# Patient Record
Sex: Female | Born: 1950
Health system: Southern US, Community
[De-identification: ages and names within clinical notes are randomized; demographics above are authoritative.]

## PROBLEM LIST (undated history)

## (undated) DIAGNOSIS — E785 Hyperlipidemia, unspecified: Secondary | ICD-10-CM

## (undated) DIAGNOSIS — F32A Depression, unspecified: Secondary | ICD-10-CM

## (undated) DIAGNOSIS — F419 Anxiety disorder, unspecified: Secondary | ICD-10-CM

## (undated) DIAGNOSIS — H9313 Tinnitus, bilateral: Secondary | ICD-10-CM

## (undated) HISTORY — DX: Depression, unspecified: F32.A

## (undated) HISTORY — DX: Tinnitus, bilateral: H93.13

## (undated) HISTORY — DX: Anxiety disorder, unspecified: F41.9

## (undated) HISTORY — DX: Hyperlipidemia, unspecified: E78.5

---

## 2009-12-01 ENCOUNTER — Other Ambulatory Visit: Admission: RE | Admit: 2009-12-01 | Discharge: 2009-12-01 | Payer: Self-pay | Admitting: *Deleted

## 2009-12-05 ENCOUNTER — Encounter: Admission: RE | Admit: 2009-12-05 | Discharge: 2009-12-05 | Payer: Self-pay | Admitting: *Deleted

## 2010-09-01 ENCOUNTER — Other Ambulatory Visit: Payer: Self-pay | Admitting: Internal Medicine

## 2010-09-01 DIAGNOSIS — R102 Pelvic and perineal pain: Secondary | ICD-10-CM

## 2010-09-03 ENCOUNTER — Other Ambulatory Visit: Payer: Self-pay

## 2010-09-03 ENCOUNTER — Ambulatory Visit
Admission: RE | Admit: 2010-09-03 | Discharge: 2010-09-03 | Disposition: A | Discharge status: Home / Self Care | Payer: Managed Care, Other (non HMO) | Source: Ambulatory Visit | Attending: Internal Medicine | Admitting: Internal Medicine

## 2010-09-03 DIAGNOSIS — R102 Pelvic and perineal pain: Secondary | ICD-10-CM

## 2011-11-22 ENCOUNTER — Other Ambulatory Visit (HOSPITAL_COMMUNITY)
Admission: RE | Admit: 2011-11-22 | Discharge: 2011-11-22 | Disposition: A | Discharge status: Home / Self Care | Payer: 59 | Source: Ambulatory Visit | Attending: Family Medicine | Admitting: Family Medicine

## 2011-11-22 ENCOUNTER — Other Ambulatory Visit: Payer: Self-pay | Admitting: Family Medicine

## 2011-11-22 DIAGNOSIS — Z780 Asymptomatic menopausal state: Secondary | ICD-10-CM

## 2011-11-22 DIAGNOSIS — Z1231 Encounter for screening mammogram for malignant neoplasm of breast: Secondary | ICD-10-CM

## 2011-11-22 DIAGNOSIS — Z01419 Encounter for gynecological examination (general) (routine) without abnormal findings: Secondary | ICD-10-CM | POA: Insufficient documentation

## 2011-12-15 ENCOUNTER — Ambulatory Visit
Admission: RE | Admit: 2011-12-15 | Discharge: 2011-12-15 | Disposition: A | Discharge status: Home / Self Care | Payer: Managed Care, Other (non HMO) | Source: Ambulatory Visit | Attending: Family Medicine | Admitting: Family Medicine

## 2011-12-15 ENCOUNTER — Other Ambulatory Visit: Payer: Managed Care, Other (non HMO)

## 2011-12-15 DIAGNOSIS — Z1231 Encounter for screening mammogram for malignant neoplasm of breast: Secondary | ICD-10-CM

## 2011-12-27 ENCOUNTER — Ambulatory Visit
Admission: RE | Admit: 2011-12-27 | Discharge: 2011-12-27 | Disposition: A | Discharge status: Home / Self Care | Payer: 59 | Source: Ambulatory Visit | Attending: Family Medicine | Admitting: Family Medicine

## 2011-12-27 DIAGNOSIS — Z780 Asymptomatic menopausal state: Secondary | ICD-10-CM

## 2013-03-07 LAB — HM COLONOSCOPY

## 2013-11-13 ENCOUNTER — Other Ambulatory Visit: Payer: Self-pay | Admitting: Family Medicine

## 2013-11-13 DIAGNOSIS — M8580 Other specified disorders of bone density and structure, unspecified site: Secondary | ICD-10-CM

## 2013-11-13 DIAGNOSIS — Z1231 Encounter for screening mammogram for malignant neoplasm of breast: Secondary | ICD-10-CM

## 2014-01-01 ENCOUNTER — Ambulatory Visit
Admission: RE | Admit: 2014-01-01 | Discharge: 2014-01-01 | Disposition: A | Discharge status: Home / Self Care | Payer: BC Managed Care – PPO | Source: Ambulatory Visit | Attending: Family Medicine | Admitting: Family Medicine

## 2014-01-01 ENCOUNTER — Encounter (INDEPENDENT_AMBULATORY_CARE_PROVIDER_SITE_OTHER): Payer: Self-pay

## 2014-01-01 DIAGNOSIS — Z1231 Encounter for screening mammogram for malignant neoplasm of breast: Secondary | ICD-10-CM

## 2014-01-01 DIAGNOSIS — M8580 Other specified disorders of bone density and structure, unspecified site: Secondary | ICD-10-CM

## 2014-08-15 ENCOUNTER — Other Ambulatory Visit: Payer: Self-pay | Admitting: Sports Medicine

## 2014-08-15 DIAGNOSIS — M79605 Pain in left leg: Secondary | ICD-10-CM

## 2014-08-15 DIAGNOSIS — M545 Low back pain: Secondary | ICD-10-CM

## 2014-08-25 ENCOUNTER — Ambulatory Visit
Admission: RE | Admit: 2014-08-25 | Discharge: 2014-08-25 | Disposition: A | Discharge status: Home / Self Care | Payer: Self-pay | Source: Ambulatory Visit | Attending: Sports Medicine | Admitting: Sports Medicine

## 2014-08-25 DIAGNOSIS — M79605 Pain in left leg: Secondary | ICD-10-CM

## 2014-08-25 DIAGNOSIS — M545 Low back pain: Secondary | ICD-10-CM

## 2015-11-18 ENCOUNTER — Other Ambulatory Visit: Payer: Self-pay | Admitting: Family Medicine

## 2015-11-18 ENCOUNTER — Other Ambulatory Visit (HOSPITAL_COMMUNITY)
Admission: RE | Admit: 2015-11-18 | Discharge: 2015-11-18 | Disposition: A | Discharge status: Home / Self Care | Payer: BLUE CROSS/BLUE SHIELD | Source: Ambulatory Visit | Attending: Family Medicine | Admitting: Family Medicine

## 2015-11-18 DIAGNOSIS — Z01419 Encounter for gynecological examination (general) (routine) without abnormal findings: Secondary | ICD-10-CM | POA: Insufficient documentation

## 2015-11-20 LAB — CYTOLOGY - PAP

## 2016-08-12 ENCOUNTER — Other Ambulatory Visit: Payer: Self-pay | Admitting: Family Medicine

## 2016-08-12 ENCOUNTER — Ambulatory Visit
Admission: RE | Admit: 2016-08-12 | Discharge: 2016-08-12 | Disposition: A | Discharge status: Home / Self Care | Payer: BLUE CROSS/BLUE SHIELD | Source: Ambulatory Visit | Attending: Family Medicine | Admitting: Family Medicine

## 2016-08-12 DIAGNOSIS — R042 Hemoptysis: Secondary | ICD-10-CM

## 2017-04-21 ENCOUNTER — Other Ambulatory Visit: Payer: Self-pay | Admitting: Family Medicine

## 2017-04-21 DIAGNOSIS — Z1231 Encounter for screening mammogram for malignant neoplasm of breast: Secondary | ICD-10-CM

## 2017-05-04 ENCOUNTER — Ambulatory Visit
Admission: RE | Admit: 2017-05-04 | Discharge: 2017-05-04 | Disposition: A | Discharge status: Home / Self Care | Payer: BLUE CROSS/BLUE SHIELD | Source: Ambulatory Visit | Attending: Family Medicine | Admitting: Family Medicine

## 2017-05-04 DIAGNOSIS — Z1231 Encounter for screening mammogram for malignant neoplasm of breast: Secondary | ICD-10-CM

## 2018-03-07 DIAGNOSIS — L570 Actinic keratosis: Secondary | ICD-10-CM | POA: Diagnosis not present

## 2018-03-07 DIAGNOSIS — L821 Other seborrheic keratosis: Secondary | ICD-10-CM | POA: Diagnosis not present

## 2018-03-07 DIAGNOSIS — L820 Inflamed seborrheic keratosis: Secondary | ICD-10-CM | POA: Diagnosis not present

## 2018-03-07 DIAGNOSIS — D1801 Hemangioma of skin and subcutaneous tissue: Secondary | ICD-10-CM | POA: Diagnosis not present

## 2018-03-07 DIAGNOSIS — L812 Freckles: Secondary | ICD-10-CM | POA: Diagnosis not present

## 2018-03-07 DIAGNOSIS — Z85828 Personal history of other malignant neoplasm of skin: Secondary | ICD-10-CM | POA: Diagnosis not present

## 2018-04-24 DIAGNOSIS — E782 Mixed hyperlipidemia: Secondary | ICD-10-CM | POA: Diagnosis not present

## 2018-04-24 DIAGNOSIS — Z23 Encounter for immunization: Secondary | ICD-10-CM | POA: Diagnosis not present

## 2018-04-24 DIAGNOSIS — Z5181 Encounter for therapeutic drug level monitoring: Secondary | ICD-10-CM | POA: Diagnosis not present

## 2018-04-24 DIAGNOSIS — Z0000 Encounter for general adult medical examination without abnormal findings: Secondary | ICD-10-CM | POA: Diagnosis not present

## 2018-05-05 ENCOUNTER — Ambulatory Visit
Admission: RE | Admit: 2018-05-05 | Discharge: 2018-05-05 | Disposition: A | Discharge status: Home / Self Care | Payer: PPO | Source: Ambulatory Visit

## 2018-05-05 DIAGNOSIS — Z1231 Encounter for screening mammogram for malignant neoplasm of breast: Secondary | ICD-10-CM

## 2018-05-16 DIAGNOSIS — K1121 Acute sialoadenitis: Secondary | ICD-10-CM | POA: Diagnosis not present

## 2018-05-16 DIAGNOSIS — H6123 Impacted cerumen, bilateral: Secondary | ICD-10-CM | POA: Diagnosis not present

## 2018-07-10 DIAGNOSIS — J3501 Chronic tonsillitis: Secondary | ICD-10-CM | POA: Diagnosis not present

## 2018-07-10 DIAGNOSIS — K1120 Sialoadenitis, unspecified: Secondary | ICD-10-CM | POA: Diagnosis not present

## 2018-07-10 DIAGNOSIS — J370 Chronic laryngitis: Secondary | ICD-10-CM | POA: Diagnosis not present

## 2018-07-17 DIAGNOSIS — K1120 Sialoadenitis, unspecified: Secondary | ICD-10-CM | POA: Diagnosis not present

## 2018-09-18 DIAGNOSIS — L812 Freckles: Secondary | ICD-10-CM | POA: Diagnosis not present

## 2018-09-18 DIAGNOSIS — L821 Other seborrheic keratosis: Secondary | ICD-10-CM | POA: Diagnosis not present

## 2018-09-18 DIAGNOSIS — L570 Actinic keratosis: Secondary | ICD-10-CM | POA: Diagnosis not present

## 2018-09-18 DIAGNOSIS — L820 Inflamed seborrheic keratosis: Secondary | ICD-10-CM | POA: Diagnosis not present

## 2018-09-18 DIAGNOSIS — Z85828 Personal history of other malignant neoplasm of skin: Secondary | ICD-10-CM | POA: Diagnosis not present

## 2018-09-18 DIAGNOSIS — D1801 Hemangioma of skin and subcutaneous tissue: Secondary | ICD-10-CM | POA: Diagnosis not present

## 2018-11-01 DIAGNOSIS — R05 Cough: Secondary | ICD-10-CM | POA: Diagnosis not present

## 2018-12-07 DIAGNOSIS — Z03818 Encounter for observation for suspected exposure to other biological agents ruled out: Secondary | ICD-10-CM | POA: Diagnosis not present

## 2019-01-02 ENCOUNTER — Other Ambulatory Visit: Payer: Self-pay | Admitting: Family Medicine

## 2019-01-02 ENCOUNTER — Other Ambulatory Visit: Payer: Self-pay

## 2019-01-02 ENCOUNTER — Ambulatory Visit
Admission: RE | Admit: 2019-01-02 | Discharge: 2019-01-02 | Disposition: A | Discharge status: Home / Self Care | Payer: PPO | Source: Ambulatory Visit | Attending: Family Medicine | Admitting: Family Medicine

## 2019-01-02 DIAGNOSIS — R059 Cough, unspecified: Secondary | ICD-10-CM

## 2019-01-02 DIAGNOSIS — R05 Cough: Secondary | ICD-10-CM | POA: Diagnosis not present

## 2019-03-08 DIAGNOSIS — R52 Pain, unspecified: Secondary | ICD-10-CM | POA: Diagnosis not present

## 2019-03-08 DIAGNOSIS — R0789 Other chest pain: Secondary | ICD-10-CM | POA: Diagnosis not present

## 2019-04-25 DIAGNOSIS — L570 Actinic keratosis: Secondary | ICD-10-CM | POA: Diagnosis not present

## 2019-04-25 DIAGNOSIS — Z85828 Personal history of other malignant neoplasm of skin: Secondary | ICD-10-CM | POA: Diagnosis not present

## 2019-04-25 DIAGNOSIS — L820 Inflamed seborrheic keratosis: Secondary | ICD-10-CM | POA: Diagnosis not present

## 2019-04-25 DIAGNOSIS — D485 Neoplasm of uncertain behavior of skin: Secondary | ICD-10-CM | POA: Diagnosis not present

## 2019-05-10 ENCOUNTER — Other Ambulatory Visit: Payer: Self-pay | Admitting: Family Medicine

## 2019-05-10 DIAGNOSIS — Z1231 Encounter for screening mammogram for malignant neoplasm of breast: Secondary | ICD-10-CM

## 2019-05-14 DIAGNOSIS — E782 Mixed hyperlipidemia: Secondary | ICD-10-CM | POA: Diagnosis not present

## 2019-05-14 DIAGNOSIS — Z5181 Encounter for therapeutic drug level monitoring: Secondary | ICD-10-CM | POA: Diagnosis not present

## 2019-05-15 ENCOUNTER — Ambulatory Visit
Admission: RE | Admit: 2019-05-15 | Discharge: 2019-05-15 | Disposition: A | Discharge status: Home / Self Care | Payer: PPO | Source: Ambulatory Visit | Attending: Family Medicine | Admitting: Family Medicine

## 2019-05-15 ENCOUNTER — Other Ambulatory Visit: Payer: Self-pay

## 2019-05-15 DIAGNOSIS — Z1231 Encounter for screening mammogram for malignant neoplasm of breast: Secondary | ICD-10-CM

## 2019-05-16 DIAGNOSIS — Z0000 Encounter for general adult medical examination without abnormal findings: Secondary | ICD-10-CM | POA: Diagnosis not present

## 2019-05-16 DIAGNOSIS — F330 Major depressive disorder, recurrent, mild: Secondary | ICD-10-CM | POA: Diagnosis not present

## 2019-05-16 DIAGNOSIS — Z1211 Encounter for screening for malignant neoplasm of colon: Secondary | ICD-10-CM | POA: Diagnosis not present

## 2019-05-17 ENCOUNTER — Other Ambulatory Visit: Payer: Self-pay | Admitting: Family Medicine

## 2019-05-17 DIAGNOSIS — R928 Other abnormal and inconclusive findings on diagnostic imaging of breast: Secondary | ICD-10-CM

## 2019-05-17 DIAGNOSIS — N6489 Other specified disorders of breast: Secondary | ICD-10-CM

## 2019-05-18 DIAGNOSIS — Z23 Encounter for immunization: Secondary | ICD-10-CM | POA: Diagnosis not present

## 2019-05-18 DIAGNOSIS — H6123 Impacted cerumen, bilateral: Secondary | ICD-10-CM | POA: Diagnosis not present

## 2019-05-21 ENCOUNTER — Ambulatory Visit
Admission: RE | Admit: 2019-05-21 | Discharge: 2019-05-21 | Disposition: A | Discharge status: Home / Self Care | Payer: PPO | Source: Ambulatory Visit | Attending: Family Medicine | Admitting: Family Medicine

## 2019-05-21 ENCOUNTER — Other Ambulatory Visit: Payer: Self-pay

## 2019-05-21 DIAGNOSIS — N6002 Solitary cyst of left breast: Secondary | ICD-10-CM | POA: Diagnosis not present

## 2019-05-21 DIAGNOSIS — R928 Other abnormal and inconclusive findings on diagnostic imaging of breast: Secondary | ICD-10-CM

## 2019-05-22 DIAGNOSIS — R05 Cough: Secondary | ICD-10-CM | POA: Diagnosis not present

## 2019-05-24 DIAGNOSIS — U071 COVID-19: Secondary | ICD-10-CM | POA: Diagnosis not present

## 2019-05-29 DIAGNOSIS — U071 COVID-19: Secondary | ICD-10-CM | POA: Diagnosis not present

## 2019-06-18 ENCOUNTER — Other Ambulatory Visit: Payer: Self-pay | Admitting: Family Medicine

## 2019-06-21 ENCOUNTER — Other Ambulatory Visit: Payer: Self-pay

## 2019-06-21 DIAGNOSIS — Z20822 Contact with and (suspected) exposure to covid-19: Secondary | ICD-10-CM

## 2019-06-24 LAB — NOVEL CORONAVIRUS, NAA: SARS-CoV-2, NAA: NOT DETECTED

## 2019-07-18 DIAGNOSIS — H2513 Age-related nuclear cataract, bilateral: Secondary | ICD-10-CM | POA: Diagnosis not present

## 2019-07-26 DIAGNOSIS — H00024 Hordeolum internum left upper eyelid: Secondary | ICD-10-CM | POA: Diagnosis not present

## 2019-10-04 DIAGNOSIS — B078 Other viral warts: Secondary | ICD-10-CM | POA: Diagnosis not present

## 2019-10-04 DIAGNOSIS — L820 Inflamed seborrheic keratosis: Secondary | ICD-10-CM | POA: Diagnosis not present

## 2019-10-04 DIAGNOSIS — L812 Freckles: Secondary | ICD-10-CM | POA: Diagnosis not present

## 2019-10-04 DIAGNOSIS — B079 Viral wart, unspecified: Secondary | ICD-10-CM | POA: Diagnosis not present

## 2019-10-04 DIAGNOSIS — Z85828 Personal history of other malignant neoplasm of skin: Secondary | ICD-10-CM | POA: Diagnosis not present

## 2019-10-04 DIAGNOSIS — D485 Neoplasm of uncertain behavior of skin: Secondary | ICD-10-CM | POA: Diagnosis not present

## 2019-10-04 DIAGNOSIS — L821 Other seborrheic keratosis: Secondary | ICD-10-CM | POA: Diagnosis not present

## 2019-10-16 IMAGING — DX CHEST - 2 VIEW
2 series · 2 of 2 positions shown · non-contrast
Comparison: 08/12/2016

CLINICAL DATA: Cough 3 months.

EXAM:
CHEST - 2 VIEW

[dg chest 2 view (1 of 2)]
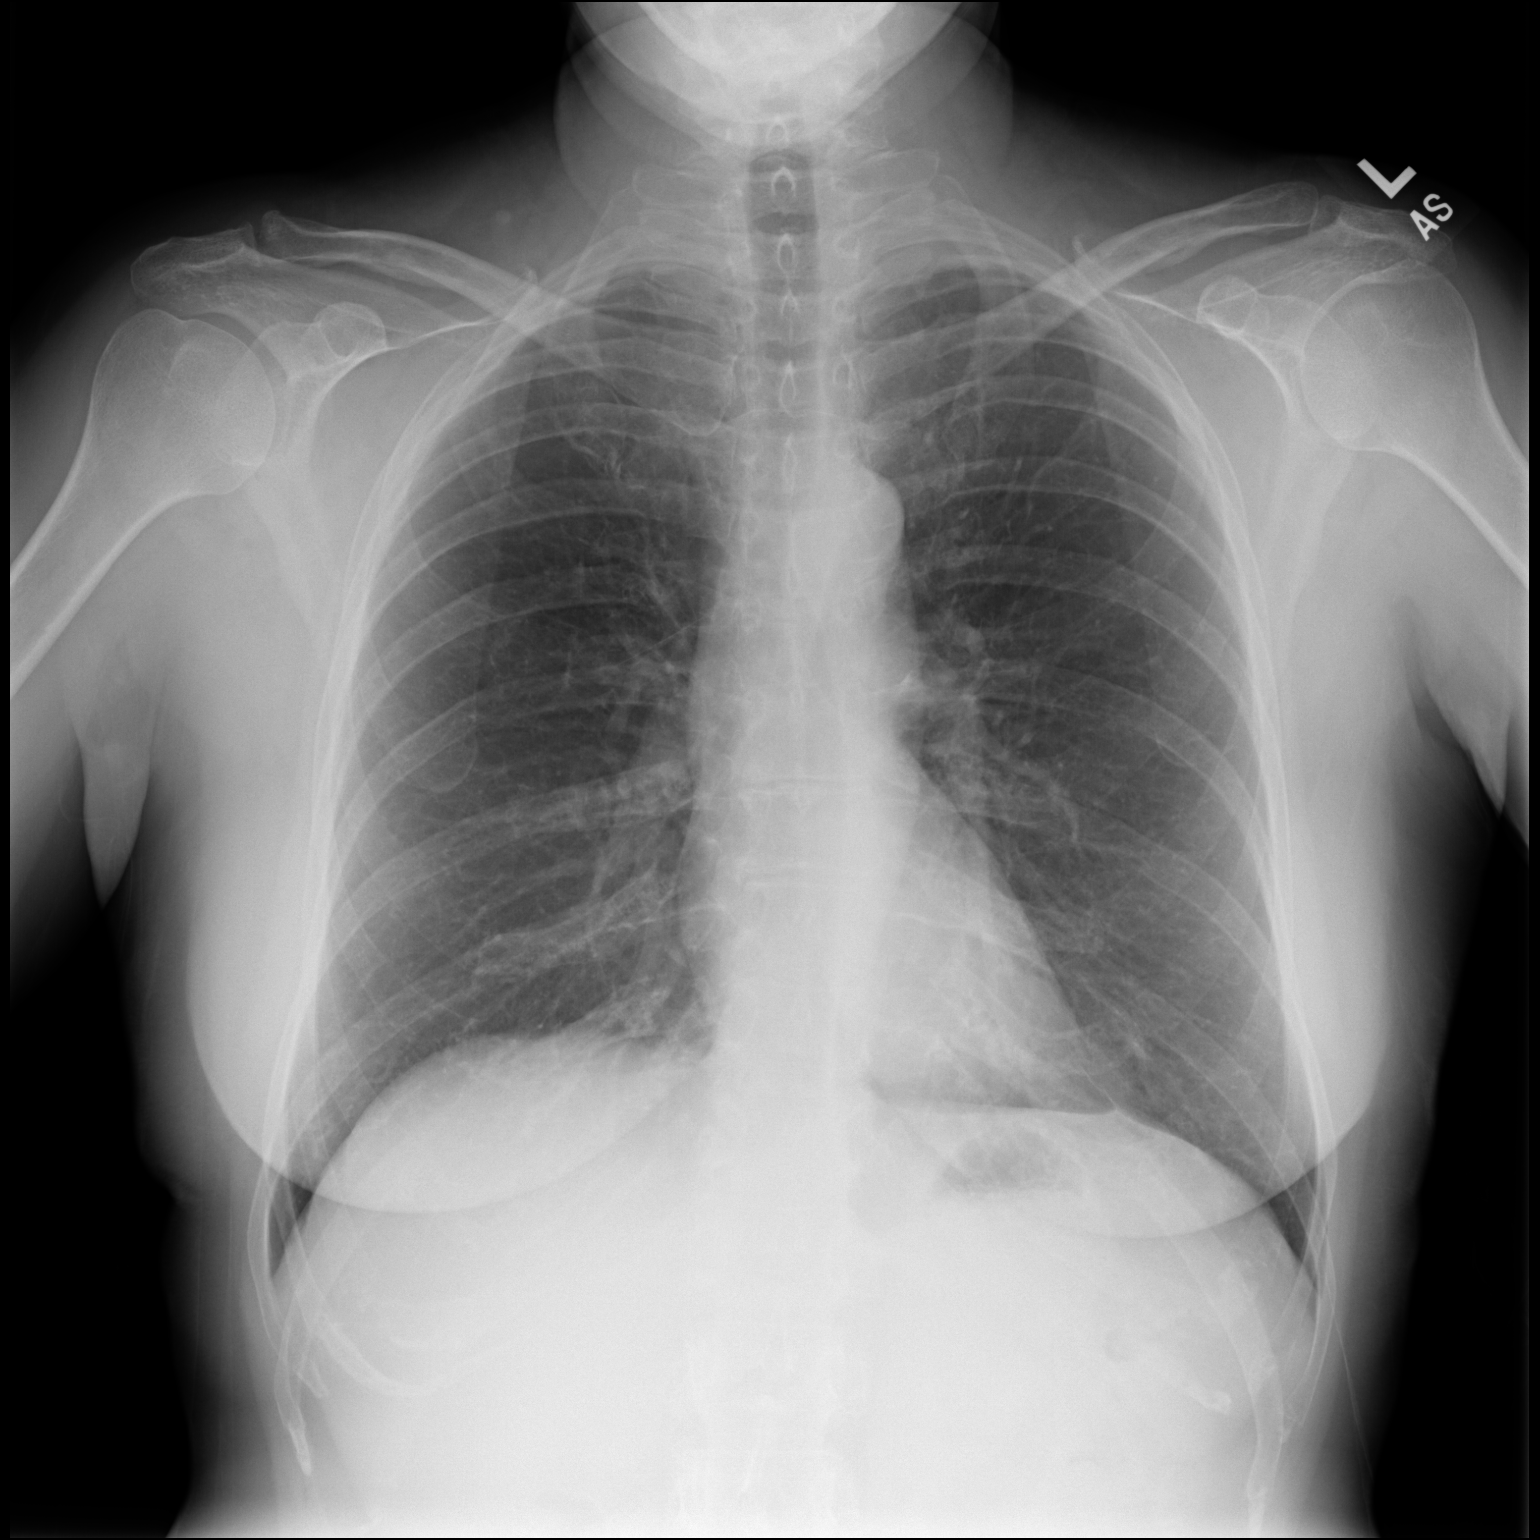

[dg chest 2 view (2 of 2)]
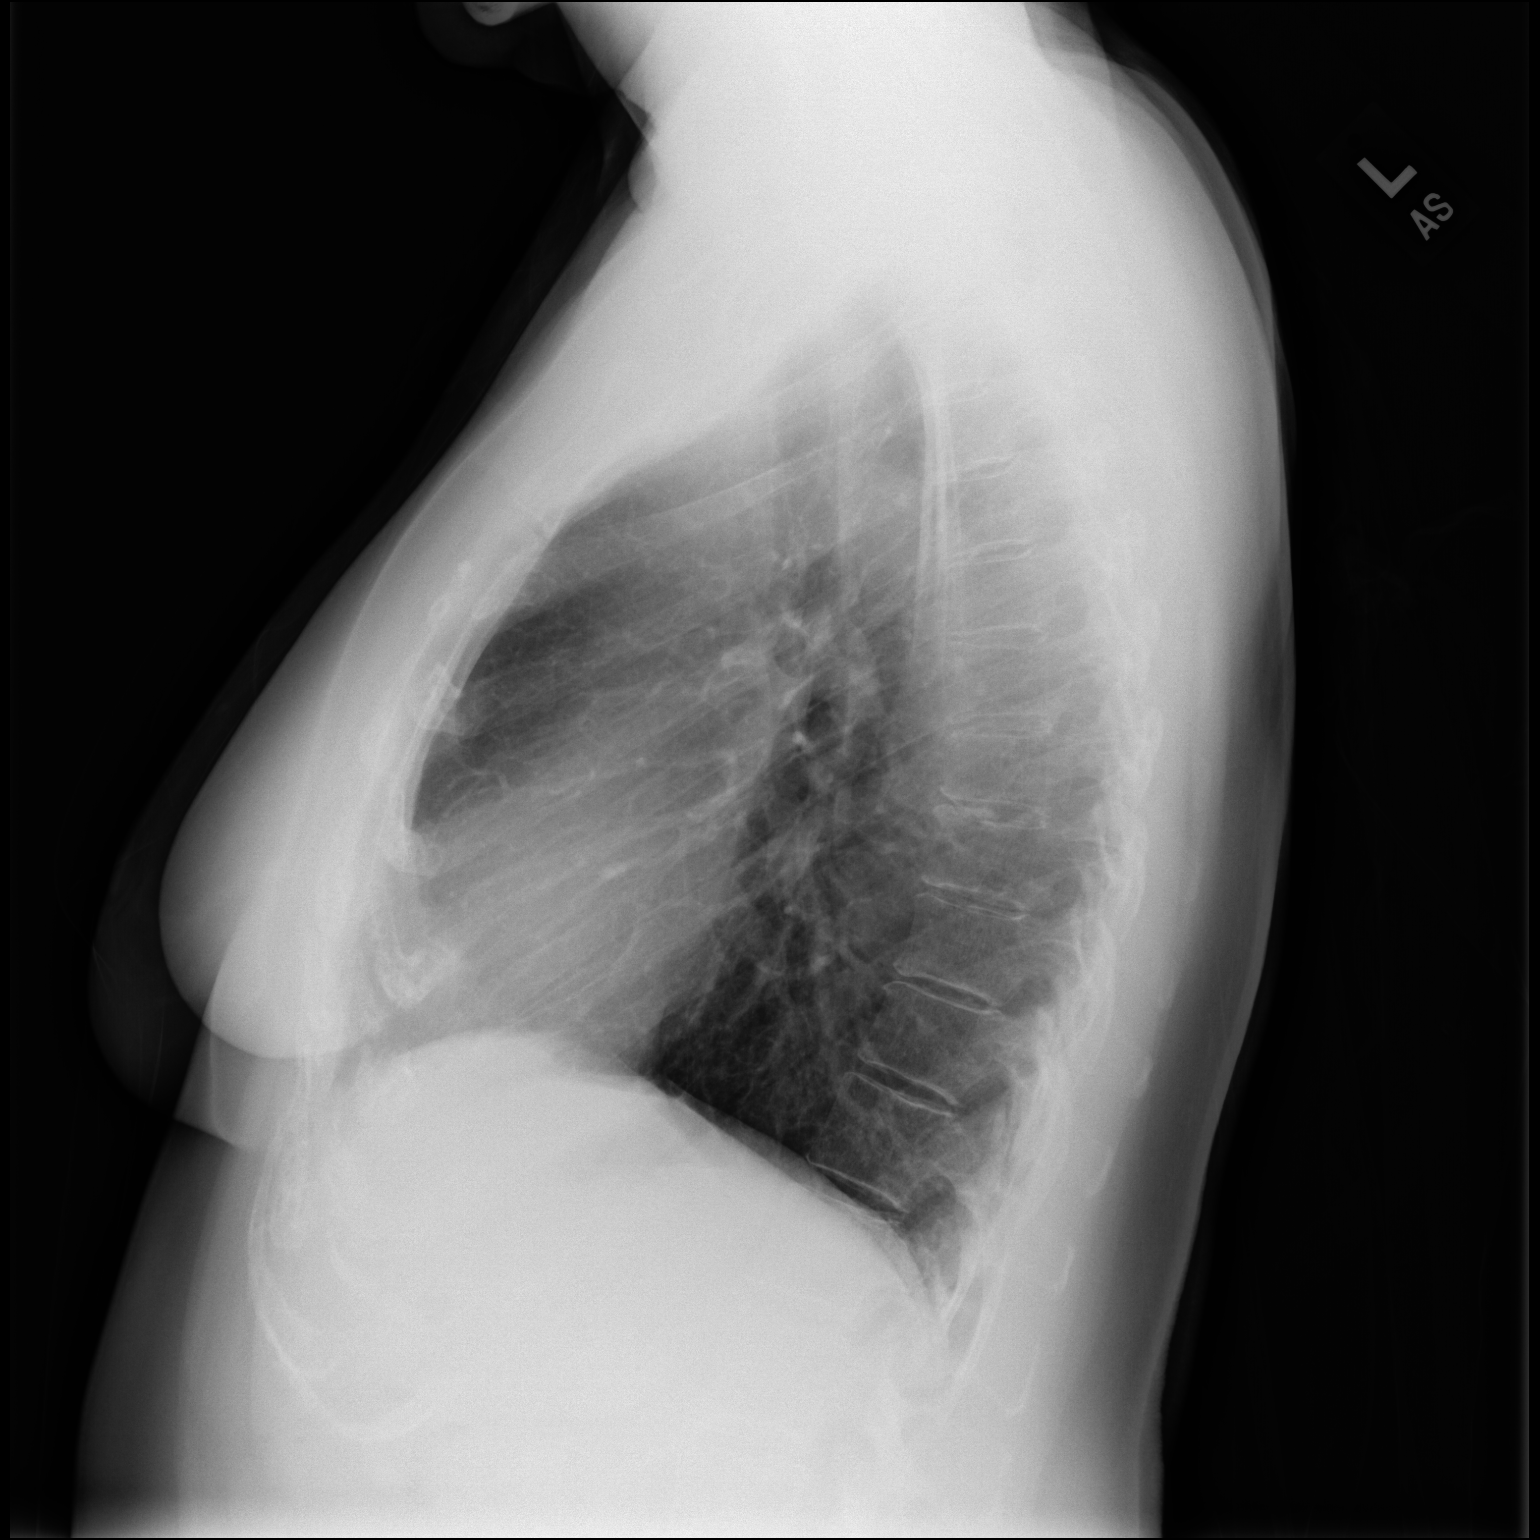

[2 of 2 positions shown; findings below may reference images not displayed]

FINDINGS: The heart size and mediastinal contours are within normal limits.
Both lungs are clear. The visualized skeletal structures are
unremarkable.
IMPRESSION: No active cardiopulmonary disease.

## 2019-10-22 DIAGNOSIS — H6121 Impacted cerumen, right ear: Secondary | ICD-10-CM | POA: Diagnosis not present

## 2019-10-22 DIAGNOSIS — H938X2 Other specified disorders of left ear: Secondary | ICD-10-CM | POA: Diagnosis not present

## 2019-10-22 DIAGNOSIS — H9313 Tinnitus, bilateral: Secondary | ICD-10-CM | POA: Diagnosis not present

## 2019-12-19 DIAGNOSIS — Z5181 Encounter for therapeutic drug level monitoring: Secondary | ICD-10-CM | POA: Diagnosis not present

## 2019-12-19 DIAGNOSIS — E782 Mixed hyperlipidemia: Secondary | ICD-10-CM | POA: Diagnosis not present

## 2019-12-19 DIAGNOSIS — M8580 Other specified disorders of bone density and structure, unspecified site: Secondary | ICD-10-CM | POA: Diagnosis not present

## 2019-12-19 DIAGNOSIS — Z0000 Encounter for general adult medical examination without abnormal findings: Secondary | ICD-10-CM | POA: Diagnosis not present

## 2019-12-19 DIAGNOSIS — F330 Major depressive disorder, recurrent, mild: Secondary | ICD-10-CM | POA: Diagnosis not present

## 2020-01-18 DIAGNOSIS — F320 Major depressive disorder, single episode, mild: Secondary | ICD-10-CM | POA: Diagnosis not present

## 2020-03-03 IMAGING — US US BREAST*L* LIMITED INC AXILLA
1 series · 5 of 5 positions shown · non-contrast
Comparison: Previous exam(s).

CLINICAL DATA: Patient presents today recall from screen for a left
breast mass.

EXAM:
ULTRASOUND OF THE LEFT BREAST

[Series 1: us breast*left* limited inc axilla · 0.06mm/px · 5 of 5 slices shown]
[im 1/5]
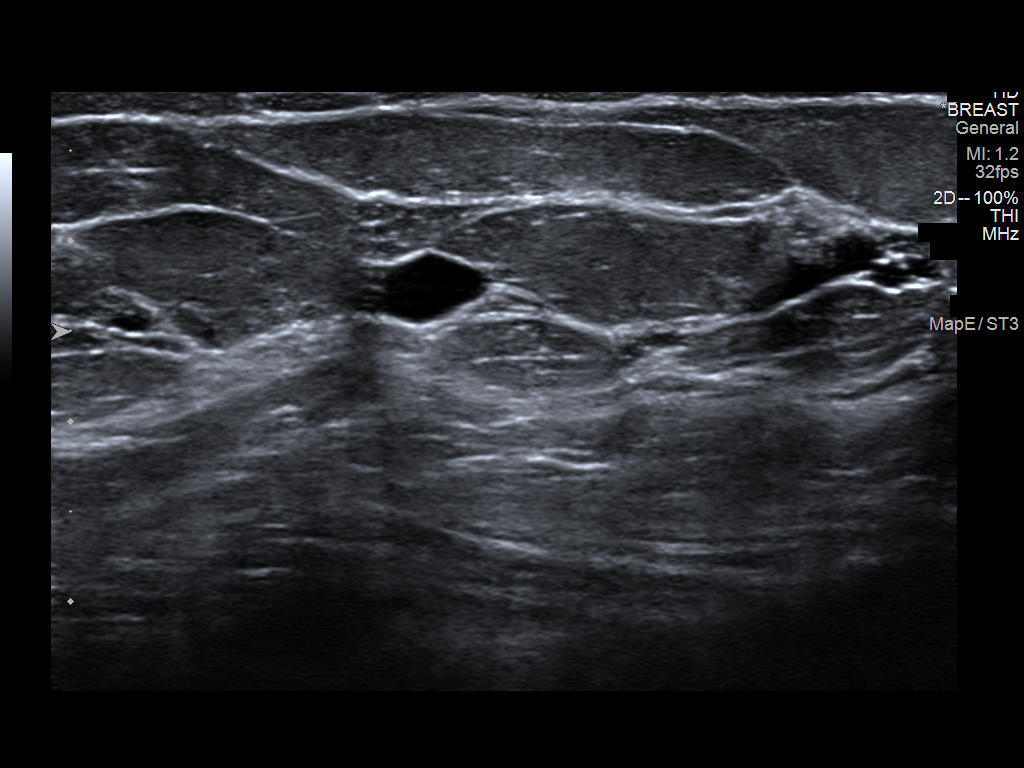
[im 2/5]
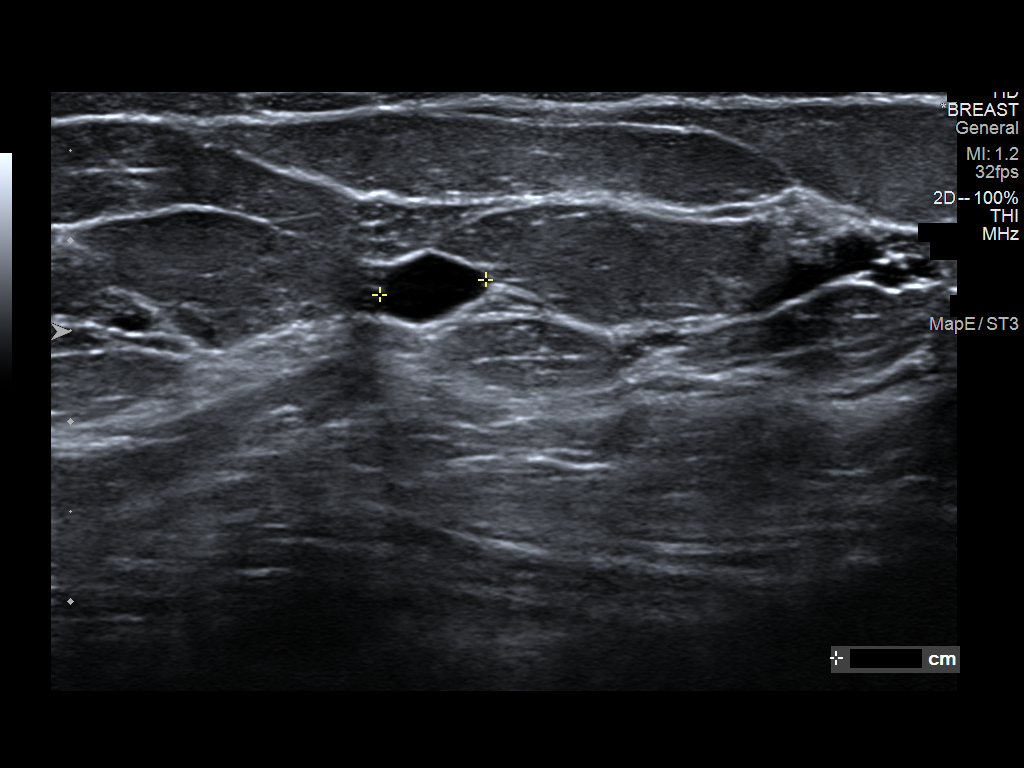
[im 3/5]
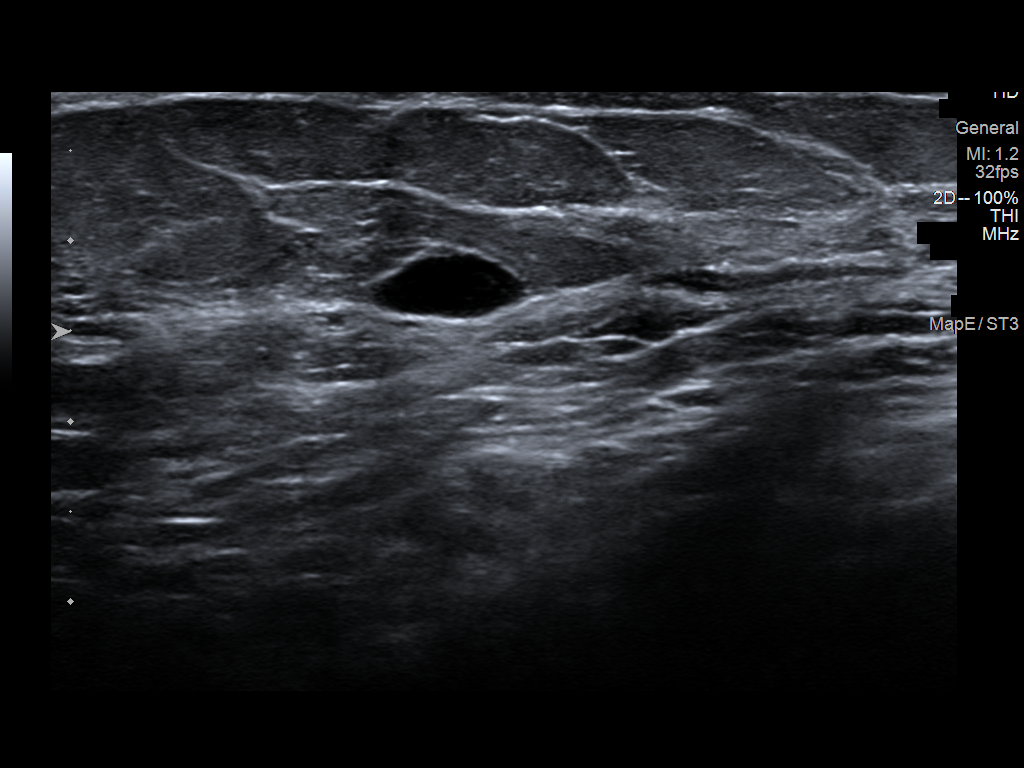
[im 4/5]
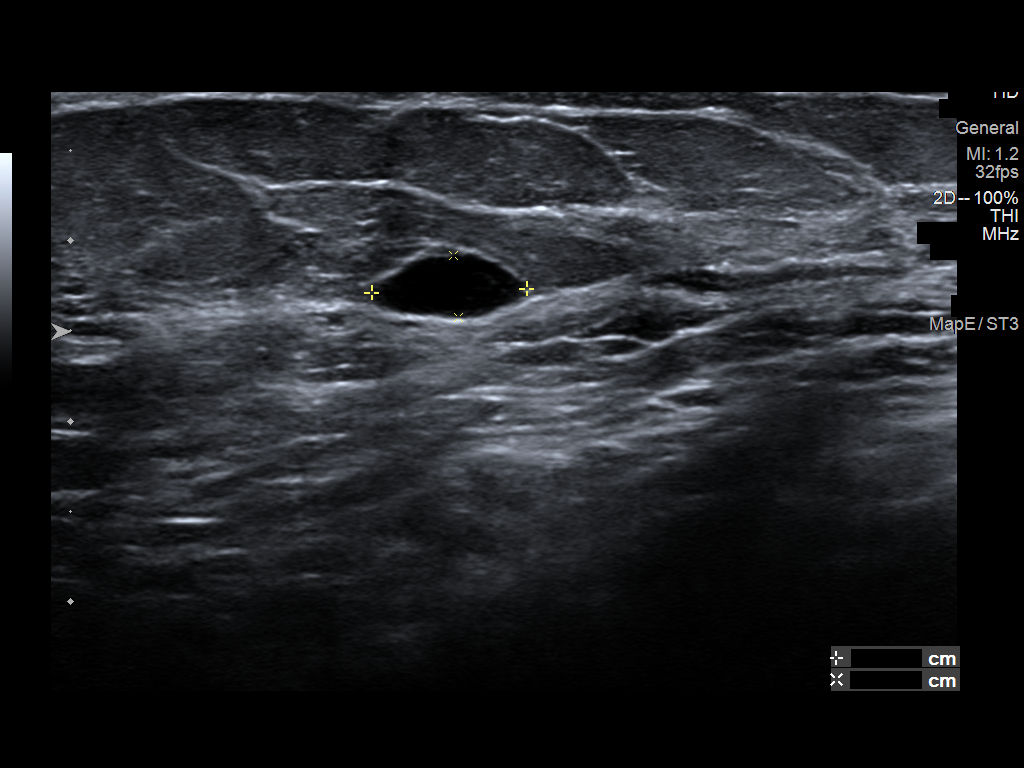
[im 5/5]
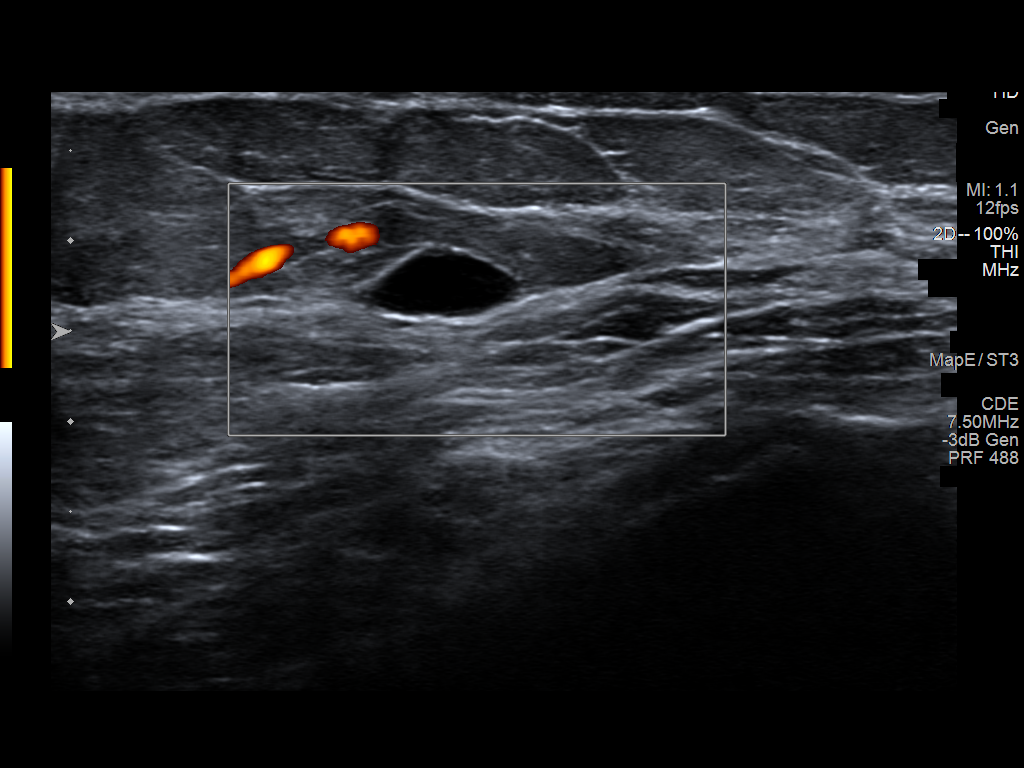

[5 of 5 positions shown; findings below may reference images not displayed]

FINDINGS: Targeted ultrasound is performed at 3 o'clock 2 cm from the nipple
in the left breast demonstrating an oval circumscribed anechoic mass
measuring 0.9 x 0.3 x 0.6 cm, which corresponds to the mammographic
finding.
IMPRESSION: Left breast simple cyst at 3 o'clock measuring 0.9 cm is benign.

RECOMMENDATION:
Screening mammogram in one year.(Code:[id number])

I have discussed the findings and recommendations with the patient.
If applicable, a reminder letter will be sent to the patient
regarding the next appointment.

BI-RADS CATEGORY  2: Benign.

## 2020-04-10 DIAGNOSIS — L821 Other seborrheic keratosis: Secondary | ICD-10-CM | POA: Diagnosis not present

## 2020-04-10 DIAGNOSIS — Z85828 Personal history of other malignant neoplasm of skin: Secondary | ICD-10-CM | POA: Diagnosis not present

## 2020-04-10 DIAGNOSIS — D1801 Hemangioma of skin and subcutaneous tissue: Secondary | ICD-10-CM | POA: Diagnosis not present

## 2020-04-10 DIAGNOSIS — L814 Other melanin hyperpigmentation: Secondary | ICD-10-CM | POA: Diagnosis not present

## 2020-04-10 DIAGNOSIS — L820 Inflamed seborrheic keratosis: Secondary | ICD-10-CM | POA: Diagnosis not present

## 2020-04-17 DIAGNOSIS — E611 Iron deficiency: Secondary | ICD-10-CM | POA: Diagnosis not present

## 2020-04-17 DIAGNOSIS — Z23 Encounter for immunization: Secondary | ICD-10-CM | POA: Diagnosis not present

## 2020-04-17 DIAGNOSIS — R5382 Chronic fatigue, unspecified: Secondary | ICD-10-CM | POA: Diagnosis not present

## 2020-04-17 DIAGNOSIS — E538 Deficiency of other specified B group vitamins: Secondary | ICD-10-CM | POA: Diagnosis not present

## 2020-04-17 DIAGNOSIS — R739 Hyperglycemia, unspecified: Secondary | ICD-10-CM | POA: Diagnosis not present

## 2020-04-17 DIAGNOSIS — G629 Polyneuropathy, unspecified: Secondary | ICD-10-CM | POA: Diagnosis not present

## 2020-04-24 ENCOUNTER — Other Ambulatory Visit: Payer: Self-pay | Admitting: Family Medicine

## 2020-04-24 DIAGNOSIS — Z1231 Encounter for screening mammogram for malignant neoplasm of breast: Secondary | ICD-10-CM

## 2020-06-19 ENCOUNTER — Other Ambulatory Visit: Payer: Self-pay

## 2020-06-19 ENCOUNTER — Ambulatory Visit
Admission: RE | Admit: 2020-06-19 | Discharge: 2020-06-19 | Disposition: A | Discharge status: Home / Self Care | Payer: PPO | Source: Ambulatory Visit | Attending: Family Medicine | Admitting: Family Medicine

## 2020-06-19 DIAGNOSIS — Z1231 Encounter for screening mammogram for malignant neoplasm of breast: Secondary | ICD-10-CM

## 2020-06-25 ENCOUNTER — Encounter: Payer: Self-pay | Admitting: Neurology

## 2020-06-30 DIAGNOSIS — S99912A Unspecified injury of left ankle, initial encounter: Secondary | ICD-10-CM | POA: Diagnosis not present

## 2020-06-30 DIAGNOSIS — S8262XA Displaced fracture of lateral malleolus of left fibula, initial encounter for closed fracture: Secondary | ICD-10-CM | POA: Diagnosis not present

## 2020-07-01 DIAGNOSIS — S8265XA Nondisplaced fracture of lateral malleolus of left fibula, initial encounter for closed fracture: Secondary | ICD-10-CM | POA: Diagnosis not present

## 2020-07-01 DIAGNOSIS — M25572 Pain in left ankle and joints of left foot: Secondary | ICD-10-CM | POA: Diagnosis not present

## 2020-07-08 DIAGNOSIS — S8265XA Nondisplaced fracture of lateral malleolus of left fibula, initial encounter for closed fracture: Secondary | ICD-10-CM | POA: Diagnosis not present

## 2020-08-05 DIAGNOSIS — S8292XA Unspecified fracture of left lower leg, initial encounter for closed fracture: Secondary | ICD-10-CM | POA: Diagnosis not present

## 2020-08-26 DIAGNOSIS — M25572 Pain in left ankle and joints of left foot: Secondary | ICD-10-CM | POA: Diagnosis not present

## 2020-09-17 DIAGNOSIS — H0288B Meibomian gland dysfunction left eye, upper and lower eyelids: Secondary | ICD-10-CM | POA: Diagnosis not present

## 2020-09-17 DIAGNOSIS — H43392 Other vitreous opacities, left eye: Secondary | ICD-10-CM | POA: Diagnosis not present

## 2020-09-17 DIAGNOSIS — H0288A Meibomian gland dysfunction right eye, upper and lower eyelids: Secondary | ICD-10-CM | POA: Diagnosis not present

## 2020-09-17 DIAGNOSIS — H2513 Age-related nuclear cataract, bilateral: Secondary | ICD-10-CM | POA: Diagnosis not present

## 2020-09-22 ENCOUNTER — Ambulatory Visit: Payer: PPO | Admitting: Neurology

## 2020-09-23 DIAGNOSIS — S8292XA Unspecified fracture of left lower leg, initial encounter for closed fracture: Secondary | ICD-10-CM | POA: Diagnosis not present

## 2020-10-24 ENCOUNTER — Other Ambulatory Visit: Payer: Self-pay | Admitting: Family Medicine

## 2020-10-24 DIAGNOSIS — E2839 Other primary ovarian failure: Secondary | ICD-10-CM

## 2020-12-25 NOTE — Progress Notes (Signed)
[facility] Clinic Note - Initial Visit   Date: 12/26/20  Breanna Prince MRN: [id number] DOB: 22-Nov-1950   Dear Dr. Justin Mend:  Thank you for your kind referral of Breanna Prince for consultation of finger paresthesias. Although her history is well known to you, please allow [place] to reiterate it for the purpose of our medical record. The patient was accompanied to the clinic by self.   History of Present Illness: Breanna Prince is a 70 y.o. right-handed female with depression/anxiety presenting for evaluation of numbness/tingling.   She had COVID infection in November 2020 which was treated conservatively.  Since this time, she developed gradual onset of numbness/tingling involving the fingers and toes up to the mid-foot.  Symptoms are constant.  No exacerbating factors.  She has some relief with ibuprofen. She denies imbalance or weakness. She suffered a fall in December 2021 and fractured her left ankle.  She did not need surgery.  She had gestational diabetes.  No history of heavy alcohol use, family history of neuropathy.   She retired from [employer]. Lives at home in one-level home.   Out-side paper records, electronic medical record, and images have been reviewed where available and summarized as:  Labs 04/2020:  vitamin B12 374, TSH 2.97, HbA1c 6.0  Past Medical History:   Depression Anxiety   Medications:  Outpatient Encounter Medications as of 12/26/2020  Medication Sig   ALPRAZolam (XANAX) 1 MG tablet alprazolam 1 mg tablet   sertraline (ZOLOFT) 100 MG tablet Zoloft 100 mg tablet  Take 1 tablet every day by oral route.   No facility-administered encounter medications on file as of 12/26/2020.    Allergies: No Known Allergies  Family History: Family History  Problem Relation Age of Onset   Ovarian cancer Mother    Congestive Heart Failure Father     Social History: Social History   Tobacco Use   Smoking status: Never   Smokeless  tobacco: Never  Substance Use Topics   Alcohol use: Yes    Comment: Occasional Drink   Drug use: Never   Social History   Social History Narrative   Right Handed   Lives in a one story home    Drinks Caffeine - Black Tea    Vital Signs:  BP 129/80   Pulse 72   Ht 5\' 3"  (1.6 m)   Wt 138 lb (62.6 kg)   SpO2 96%   BMI 24.45 kg/m   Neurological Exam: MENTAL STATUS including orientation to time, place, person, recent and remote memory, attention span and concentration, language, and fund of knowledge is normal.  Speech is not dysarthric.  CRANIAL NERVES: II:  No visual field defects.  III-IV-VI: Pupils equal round and reactive to light.  Normal conjugate, extra-ocular eye movements in all directions of gaze.  No nystagmus.  No ptosis.   V:  Normal facial sensation.    VII:  Normal facial symmetry and movements.   VIII:  Normal hearing and vestibular function.   IX-X:  Normal palatal movement.   XI:  Normal shoulder shrug and head rotation.   XII:  Normal tongue strength and range of motion, no deviation or fasciculation.  MOTOR:  No atrophy, fasciculations or abnormal movements.  No pronator drift.   Upper Extremity:  Right  Left  Deltoid  5/5   5/5   Biceps  5/5   5/5   Triceps  5/5   5/5   Infraspinatus 5/5  5/5  Medial pectoralis 5/5  5/5  Wrist extensors  5/5   5/5   Wrist flexors  5/5   5/5   Finger extensors  5/5   5/5   Finger flexors  5/5   5/5   Dorsal interossei  5/5   5/5   Abductor pollicis  5/5   5/5   Tone (Ashworth scale)  0  0   Lower Extremity:  Right  Left  Hip flexors  5/5   5/5   Hip extensors  5/5   5/5   Adductor 5/5  5/5  Abductor 5/5  5/5  Knee flexors  5/5   5/5   Knee extensors  5/5   5/5   Dorsiflexors  5/5   5/5   Plantarflexors  5/5   5/5   Toe extensors  5/5   5/5   Toe flexors  5/5   5/5   Tone (Ashworth scale)  0  0   MSRs:  Right        Left                  brachioradialis 2+  2+  biceps 2+  2+  triceps 2+  2+   patellar 2+  2+  ankle jerk 2+  2+  Hoffman no  no  plantar response down  down   SENSORY:  Reduced pin prick over the finger pads bilaterally, otherwise normal and symmetric perception of light touch, pinprick, vibration, and proprioception.  Romberg's sign absent.  Tinel's negative at the wrists  COORDINATION/GAIT: Normal finger-to- nose-finger and heel-to-shin.  Intact rapid alternating movements bilaterally.  Able to rise from a chair without using arms.  Gait narrow based and stable. Tandem and stressed gait intact.    IMPRESSION: Paresthesias of the hands and feet.  Her neurological exam is remarkably normal, only mildly reduced loss of pin prick in the finger tips.  Reflexes and strength is normal throughout. She does not have risk factors for neuropathy.  Exam is not consistent with neuropathy, however, her history certainly warrants further testing with NCS/EMG of the left arm and leg. Further recommendations pending results.   Thank you for allowing me to participate in patient's care.  If I can answer any additional questions, I would be pleased to do so.    Sincerely,    Jaquann Guarisco K. Posey Pronto, DO

## 2020-12-26 ENCOUNTER — Ambulatory Visit: Payer: PPO | Admitting: Neurology

## 2020-12-26 ENCOUNTER — Encounter: Payer: Self-pay | Admitting: Neurology

## 2020-12-26 ENCOUNTER — Other Ambulatory Visit: Payer: Self-pay

## 2020-12-26 VITALS — BP 129/80 | HR 72 | Ht 63.0 in | Wt 138.0 lb

## 2020-12-26 DIAGNOSIS — R202 Paresthesia of skin: Secondary | ICD-10-CM | POA: Diagnosis not present

## 2020-12-26 NOTE — Patient Instructions (Signed)
Nerve testing of the left arm and leg  ELECTROMYOGRAM AND NERVE CONDUCTION STUDIES (EMG/NCS) INSTRUCTIONS  How to Prepare The neurologist conducting the EMG will need to know if you have certain medical conditions. Tell the neurologist and other EMG lab personnel if you: Have a pacemaker or any other electrical medical device Take blood-thinning medications Have hemophilia, a blood-clotting disorder that causes prolonged bleeding Bathing Take a shower or bath shortly before your exam in order to remove oils from your skin. Don't apply lotions or creams before the exam.  What to Expect You'll likely be asked to change into a hospital gown for the procedure and lie down on an examination table. The following explanations can help you understand what will happen during the exam.  Electrodes. The neurologist or a technician places surface electrodes at various locations on your skin depending on where you're experiencing symptoms. Or the neurologist may insert needle electrodes at different sites depending on your symptoms.  Sensations. The electrodes will at times transmit a tiny electrical current that you may feel as a twinge or spasm. The needle electrode may cause discomfort or pain that usually ends shortly after the needle is removed. If you are concerned about discomfort or pain, you may want to talk to the neurologist about taking a short break during the exam.  Instructions. During the needle EMG, the neurologist will assess whether there is any spontaneous electrical activity when the muscle is at rest - activity that isn't present in healthy muscle tissue - and the degree of activity when you slightly contract the muscle.  He or she will give you instructions on resting and contracting a muscle at appropriate times. Depending on what muscles and nerves the neurologist is examining, he or she may ask you to change positions during the exam.  After your EMG You may experience some temporary,  minor bruising where the needle electrode was inserted into your muscle. This bruising should fade within several days. If it persists, contact your primary care doctor.

## 2021-01-14 DIAGNOSIS — F320 Major depressive disorder, single episode, mild: Secondary | ICD-10-CM | POA: Diagnosis not present

## 2021-01-14 DIAGNOSIS — Z5181 Encounter for therapeutic drug level monitoring: Secondary | ICD-10-CM | POA: Diagnosis not present

## 2021-01-14 DIAGNOSIS — Z0000 Encounter for general adult medical examination without abnormal findings: Secondary | ICD-10-CM | POA: Diagnosis not present

## 2021-01-14 DIAGNOSIS — M8580 Other specified disorders of bone density and structure, unspecified site: Secondary | ICD-10-CM | POA: Diagnosis not present

## 2021-01-14 DIAGNOSIS — R7303 Prediabetes: Secondary | ICD-10-CM | POA: Diagnosis not present

## 2021-01-14 DIAGNOSIS — E782 Mixed hyperlipidemia: Secondary | ICD-10-CM | POA: Diagnosis not present

## 2021-01-14 DIAGNOSIS — F330 Major depressive disorder, recurrent, mild: Secondary | ICD-10-CM | POA: Diagnosis not present

## 2021-02-17 ENCOUNTER — Encounter: Payer: PPO | Admitting: Neurology

## 2021-03-26 DIAGNOSIS — M25562 Pain in left knee: Secondary | ICD-10-CM | POA: Diagnosis not present

## 2021-04-02 IMAGING — MG DIGITAL SCREENING BILAT W/ TOMO W/ CAD
6 of 10 series · 6 of 30 positions shown · non-contrast
Comparison: Previous exam(s).

CLINICAL DATA: Screening.

EXAM:
DIGITAL SCREENING BILATERAL MAMMOGRAM WITH TOMO AND CAD

[R CC synth-2D (1 of 2)]
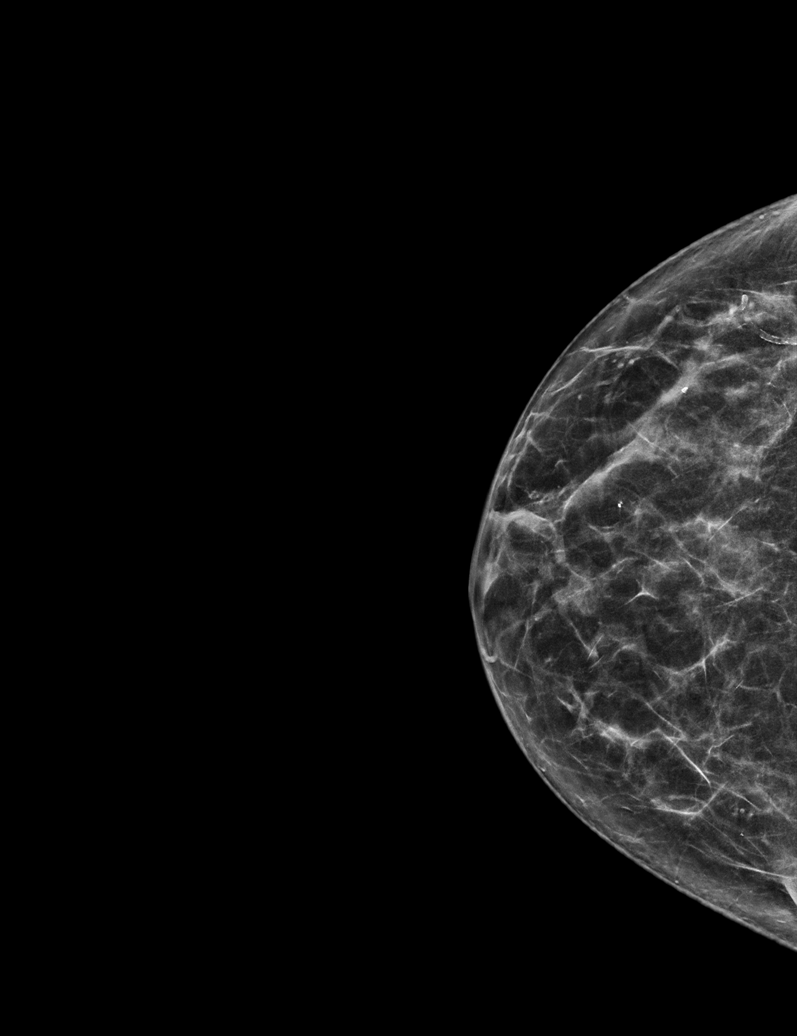

[L MLO synth-2D]
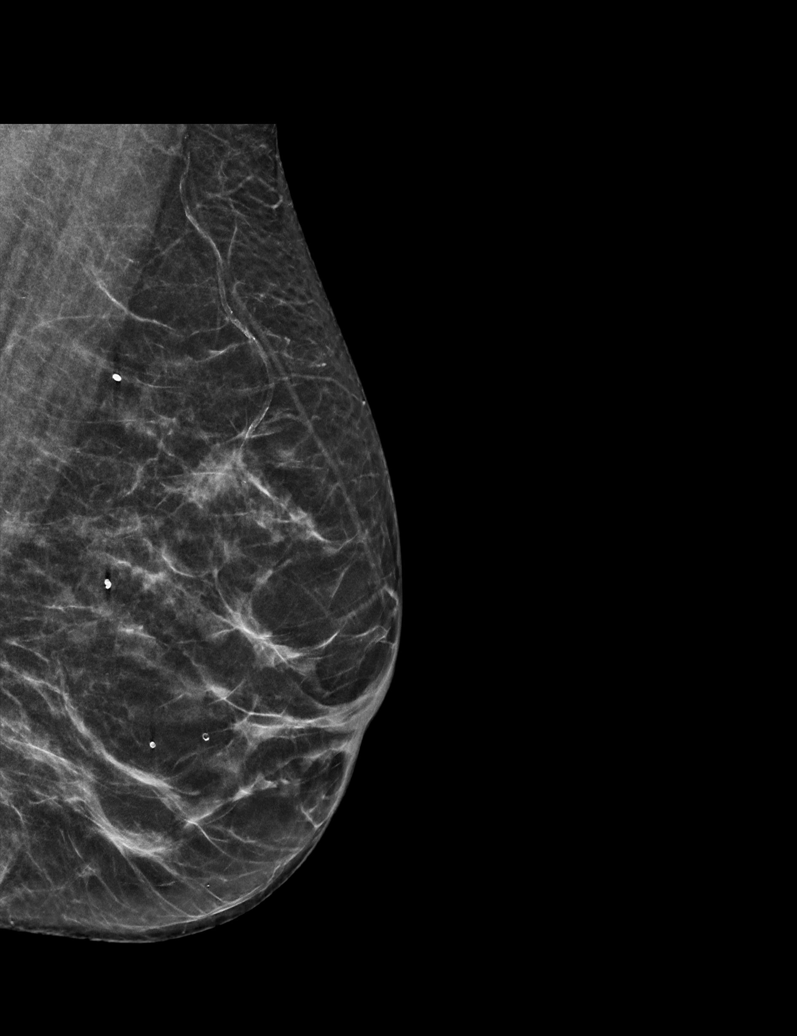

[R CC synth-2D (2 of 2)]
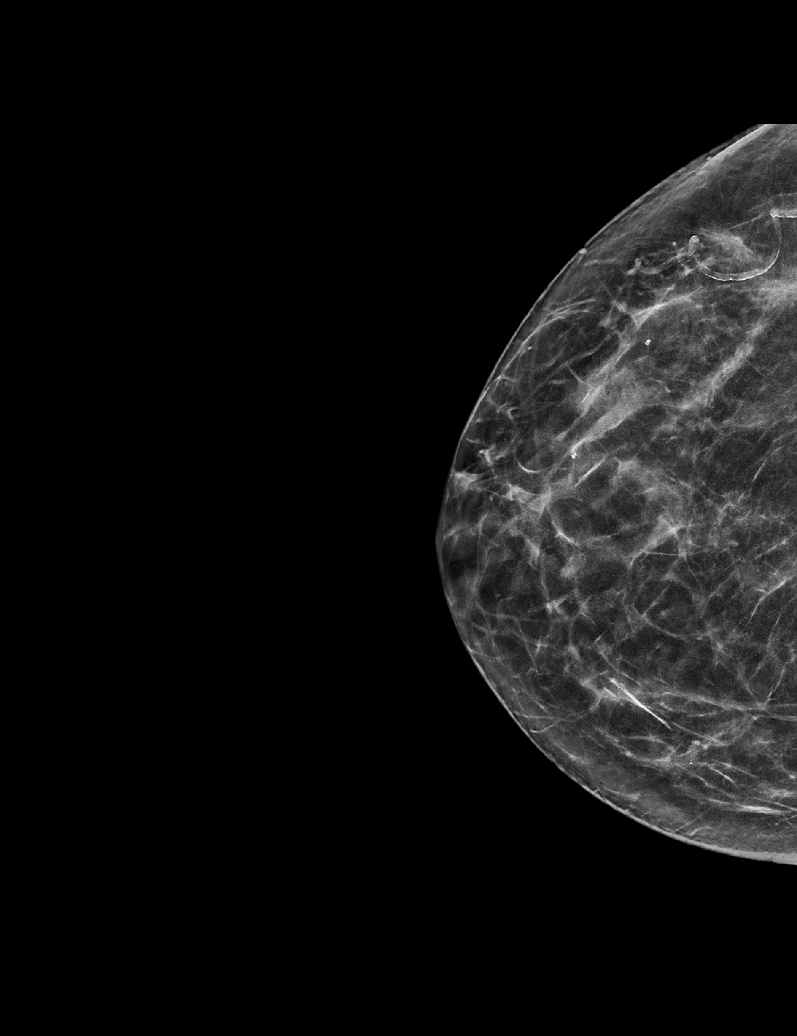

[L CC synth-2D]
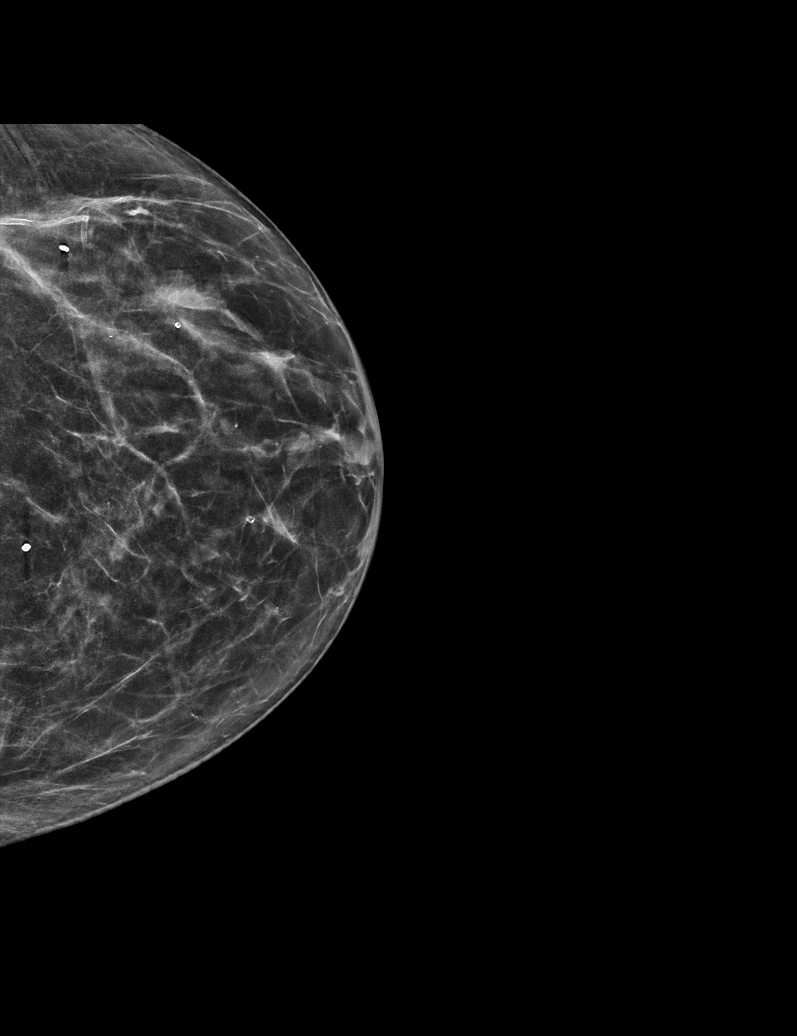

[R MLO synth-2D]
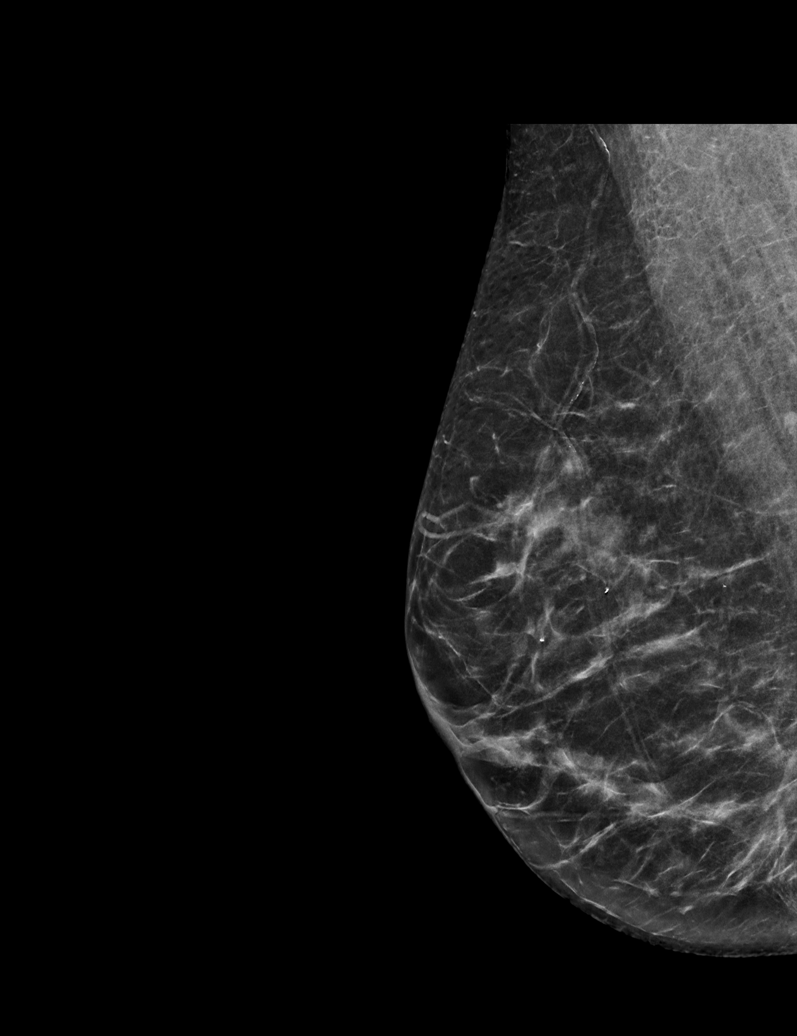

[R CC tomo · tomo slice 31/60.0]
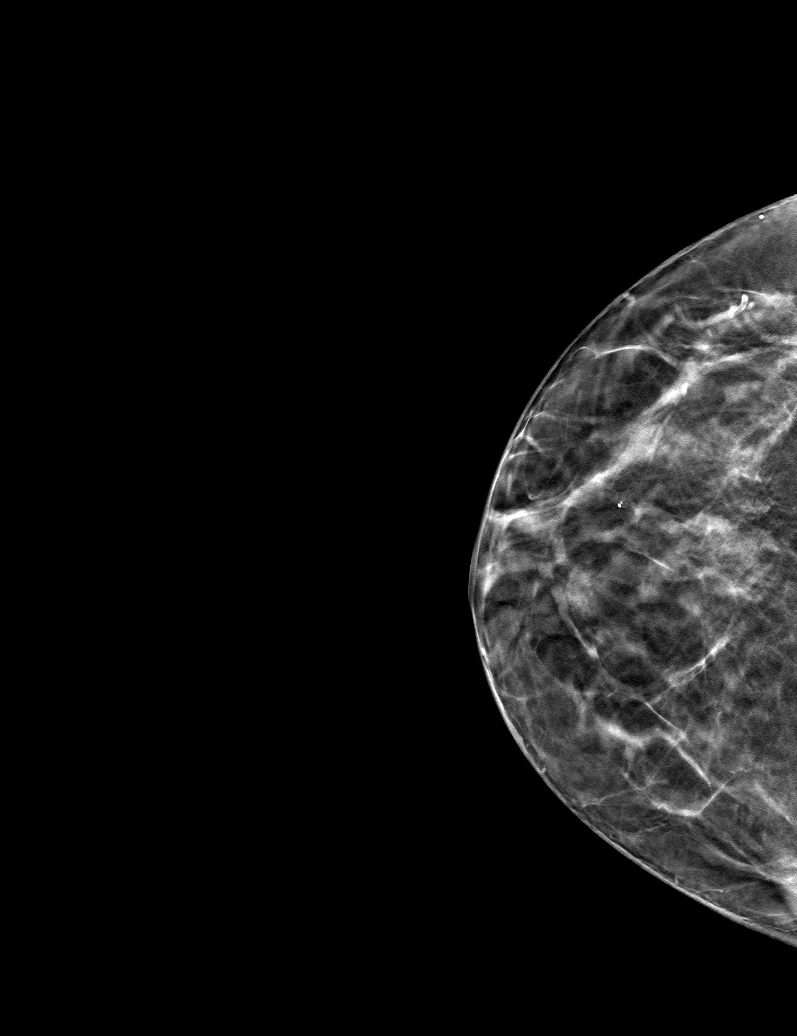

[6 of 30 positions shown; findings below may reference images not displayed]

ACR Breast Density Category b: There are scattered areas of
fibroglandular density.
FINDINGS: There are no findings suspicious for malignancy. Images were
processed with CAD.
IMPRESSION: No mammographic evidence of malignancy. A result letter of this
screening mammogram will be mailed directly to the patient.

RECOMMENDATION:
Screening mammogram in one year. (Code:[id number])

BI-RADS CATEGORY  1: Negative.

## 2021-04-13 ENCOUNTER — Ambulatory Visit
Admission: RE | Admit: 2021-04-13 | Discharge: 2021-04-13 | Disposition: A | Discharge status: Home / Self Care | Payer: PPO | Source: Ambulatory Visit | Attending: Family Medicine | Admitting: Family Medicine

## 2021-04-13 ENCOUNTER — Other Ambulatory Visit: Payer: Self-pay | Admitting: Family Medicine

## 2021-04-13 ENCOUNTER — Other Ambulatory Visit: Payer: Self-pay

## 2021-04-13 DIAGNOSIS — Z780 Asymptomatic menopausal state: Secondary | ICD-10-CM | POA: Diagnosis not present

## 2021-04-13 DIAGNOSIS — M8589 Other specified disorders of bone density and structure, multiple sites: Secondary | ICD-10-CM | POA: Diagnosis not present

## 2021-04-13 DIAGNOSIS — E2839 Other primary ovarian failure: Secondary | ICD-10-CM

## 2021-04-13 DIAGNOSIS — Z1231 Encounter for screening mammogram for malignant neoplasm of breast: Secondary | ICD-10-CM

## 2021-04-15 DIAGNOSIS — L814 Other melanin hyperpigmentation: Secondary | ICD-10-CM | POA: Diagnosis not present

## 2021-04-15 DIAGNOSIS — L720 Epidermal cyst: Secondary | ICD-10-CM | POA: Diagnosis not present

## 2021-04-15 DIAGNOSIS — Z85828 Personal history of other malignant neoplasm of skin: Secondary | ICD-10-CM | POA: Diagnosis not present

## 2021-04-15 DIAGNOSIS — L821 Other seborrheic keratosis: Secondary | ICD-10-CM | POA: Diagnosis not present

## 2021-04-15 DIAGNOSIS — L570 Actinic keratosis: Secondary | ICD-10-CM | POA: Diagnosis not present

## 2021-04-20 ENCOUNTER — Ambulatory Visit: Payer: PPO | Admitting: Neurology

## 2021-04-28 DIAGNOSIS — Z23 Encounter for immunization: Secondary | ICD-10-CM | POA: Diagnosis not present

## 2021-05-27 DIAGNOSIS — H6123 Impacted cerumen, bilateral: Secondary | ICD-10-CM | POA: Diagnosis not present

## 2021-05-27 DIAGNOSIS — H9313 Tinnitus, bilateral: Secondary | ICD-10-CM | POA: Diagnosis not present

## 2021-05-27 DIAGNOSIS — H903 Sensorineural hearing loss, bilateral: Secondary | ICD-10-CM | POA: Diagnosis not present

## 2021-06-22 ENCOUNTER — Ambulatory Visit
Admission: RE | Admit: 2021-06-22 | Discharge: 2021-06-22 | Disposition: A | Discharge status: Home / Self Care | Payer: PPO | Source: Ambulatory Visit | Attending: Family Medicine | Admitting: Family Medicine

## 2021-06-22 DIAGNOSIS — Z1231 Encounter for screening mammogram for malignant neoplasm of breast: Secondary | ICD-10-CM

## 2021-06-25 DIAGNOSIS — J069 Acute upper respiratory infection, unspecified: Secondary | ICD-10-CM | POA: Diagnosis not present

## 2021-06-25 DIAGNOSIS — J029 Acute pharyngitis, unspecified: Secondary | ICD-10-CM | POA: Diagnosis not present

## 2021-08-25 DIAGNOSIS — H9319 Tinnitus, unspecified ear: Secondary | ICD-10-CM | POA: Diagnosis not present

## 2021-08-25 DIAGNOSIS — F320 Major depressive disorder, single episode, mild: Secondary | ICD-10-CM | POA: Diagnosis not present

## 2021-08-25 DIAGNOSIS — R053 Chronic cough: Secondary | ICD-10-CM | POA: Diagnosis not present

## 2021-09-22 DIAGNOSIS — H903 Sensorineural hearing loss, bilateral: Secondary | ICD-10-CM | POA: Diagnosis not present

## 2021-10-15 DIAGNOSIS — H903 Sensorineural hearing loss, bilateral: Secondary | ICD-10-CM | POA: Diagnosis not present

## 2021-11-18 DIAGNOSIS — H16223 Keratoconjunctivitis sicca, not specified as Sjogren's, bilateral: Secondary | ICD-10-CM | POA: Diagnosis not present

## 2021-11-18 DIAGNOSIS — H2513 Age-related nuclear cataract, bilateral: Secondary | ICD-10-CM | POA: Diagnosis not present

## 2021-11-18 DIAGNOSIS — H43812 Vitreous degeneration, left eye: Secondary | ICD-10-CM | POA: Diagnosis not present

## 2021-11-25 ENCOUNTER — Ambulatory Visit: Payer: PPO | Admitting: Physician Assistant

## 2021-12-15 ENCOUNTER — Ambulatory Visit (INDEPENDENT_AMBULATORY_CARE_PROVIDER_SITE_OTHER): Payer: PPO | Admitting: Physician Assistant

## 2021-12-15 ENCOUNTER — Encounter: Payer: Self-pay | Admitting: Physician Assistant

## 2021-12-15 VITALS — BP 118/70 | HR 66 | Temp 97.3°F | Ht 63.0 in | Wt 141.0 lb

## 2021-12-15 DIAGNOSIS — E782 Mixed hyperlipidemia: Secondary | ICD-10-CM

## 2021-12-15 DIAGNOSIS — R7303 Prediabetes: Secondary | ICD-10-CM | POA: Insufficient documentation

## 2021-12-15 DIAGNOSIS — G629 Polyneuropathy, unspecified: Secondary | ICD-10-CM | POA: Diagnosis not present

## 2021-12-15 DIAGNOSIS — H9110 Presbycusis, unspecified ear: Secondary | ICD-10-CM | POA: Insufficient documentation

## 2021-12-15 DIAGNOSIS — E2839 Other primary ovarian failure: Secondary | ICD-10-CM | POA: Insufficient documentation

## 2021-12-15 DIAGNOSIS — F330 Major depressive disorder, recurrent, mild: Secondary | ICD-10-CM | POA: Diagnosis not present

## 2021-12-15 DIAGNOSIS — M8580 Other specified disorders of bone density and structure, unspecified site: Secondary | ICD-10-CM | POA: Insufficient documentation

## 2021-12-15 DIAGNOSIS — H9319 Tinnitus, unspecified ear: Secondary | ICD-10-CM | POA: Insufficient documentation

## 2021-12-15 DIAGNOSIS — G9332 Myalgic encephalomyelitis/chronic fatigue syndrome: Secondary | ICD-10-CM | POA: Insufficient documentation

## 2021-12-15 HISTORY — DX: Polyneuropathy, unspecified: G62.9

## 2021-12-15 MED ORDER — ALPRAZOLAM 1 MG PO TABS
1.0000 mg | ORAL_TABLET | Freq: Every evening | ORAL | 0 refills | Status: DC | PRN
Start: 2021-12-15 — End: 2022-09-28

## 2021-12-15 MED ORDER — SERTRALINE HCL 100 MG PO TABS
100.0000 mg | ORAL_TABLET | Freq: Every day | ORAL | 1 refills | Status: DC
Start: 2021-12-15 — End: 2022-10-04

## 2021-12-15 NOTE — Progress Notes (Signed)
Breanna Prince is a 71 y.o. female here for a follow up of a pre-existing problem.  History of Present Illness:   Chief Complaint  Patient presents with   Establish Care    HPI  Depression Patient here to establish care. She is currently taking Zoloft 100 mg daily and Xanax 1 mg as needed. She reports she has been dealing with this for a while. Her symptoms seems to be well controlled. She is complaint with her medication with no side effects. She has tried talk therapy in the past. Has no complaints about her medications and is overall doing well. She is managing well at this time and requesting refill today. Denies SI/HI.   Neuropathy  Patient has been experiencing tingling in her finger and toes. She takes Alpha-Lipoic Acid 600 mg twice daily. She reports this has ben helping. Denies any worsening symptoms. Her symptoms seems to be well controlled at this time.   Hyperlipidemia  Patient has had blood work which showed elevated LDL. Patient is currently taking fish oil and this seems to be helping. She has never been on medication. Denies any worsening symptoms.    Past Medical History:  Diagnosis Date   Anxiety    Depression    Hyperlipidemia    Tinnitus of both ears    Vaginal delivery    1976, 1977, 1980, 1982     Social History   Tobacco Use   Smoking status: Former    Packs/day: 0.50    Types: Cigarettes    Quit date: 1985    Years since quitting: 38.4   Smokeless tobacco: Never  Vaping Use   Vaping Use: Never used  Substance Use Topics   Alcohol use: Yes    Comment: Occasional Drink   Drug use: Not Currently    Types: Marijuana    History reviewed. No pertinent surgical history.  Family History  Problem Relation Age of Onset   Ovarian cancer Mother    Congestive Heart Failure Father     Allergies  Allergen Reactions   Latex Itching    Current Medications:   Current Outpatient Medications:    Alpha-Lipoic Acid 600 MG CAPS, Take 2 capsules by  mouth daily in the afternoon., Disp: , Rfl:    ALPRAZolam (XANAX) 1 MG tablet, alprazolam 1 mg tablet, Disp: , Rfl:    Magnesium 400 MG CAPS, 1 tablet, Disp: , Rfl:    Omega-3 Fatty Acids (OMEGA-3 FISH OIL PO), Take 1,380 mg by mouth daily in the afternoon., Disp: , Rfl:    sertraline (ZOLOFT) 100 MG tablet, Zoloft 100 mg tablet  Take 1 tablet every day by oral route., Disp: , Rfl:    Review of Systems:   ROS Negative unless otherwise specified per HPI.   Vitals:   Vitals:   12/15/21 0850  BP: 118/70  Pulse: 66  Temp: (!) 97.3 F (36.3 C)  TempSrc: Temporal  SpO2: 98%  Weight: 141 lb (64 kg)  Height: '5\' 3"'$  (1.6 m)     Body mass index is 24.98 kg/m.  Physical Exam:   Physical Exam Vitals and nursing note reviewed.  Constitutional:      General: She is not in acute distress.    Appearance: She is well-developed. She is not ill-appearing or toxic-appearing.  Cardiovascular:     Rate and Rhythm: Normal rate and regular rhythm.     Pulses: Normal pulses.     Heart sounds: Normal heart sounds, S1 normal and S2 normal.  Pulmonary:     Effort: Pulmonary effort is normal.     Breath sounds: Normal breath sounds.  Skin:    General: Skin is warm and dry.  Neurological:     Mental Status: She is alert.     GCS: GCS eye subscore is 4. GCS verbal subscore is 5. GCS motor subscore is 6.  Psychiatric:        Speech: Speech normal.        Behavior: Behavior normal. Behavior is cooperative.    Assessment and Plan:   Mild recurrent major depression ([place]) Well controlled Continue zoloft 100 mg daily and xanax 1 mg prn Follow-up in 3 months, sooner if concerns  Neuropathy Per patient well controlled Continue to monitor  Mixed hyperlipidemia Will update blood work when due for CPE and provide recommendations accordingly   [employer] as a Education administrator for [employer], PA.,have documented all relevant documentation on the behalf of Inda Coke, PA,as directed by   Inda Coke, PA while in the presence of Inda Coke, [place].    I, Inda Coke, [place], have reviewed all documentation for this visit. The documentation on 12/15/21 for the exam, diagnosis, procedures, and orders are all accurate and complete.   Inda Coke, PA-C

## 2021-12-15 NOTE — Patient Instructions (Signed)
It was great to see you!  Refills sent  Follow-up for your physical when you are ready after 7/30  Take care,  Inda Coke PA-C

## 2022-01-01 ENCOUNTER — Encounter: Payer: Self-pay | Admitting: Physician Assistant

## 2022-01-01 ENCOUNTER — Ambulatory Visit (HOSPITAL_BASED_OUTPATIENT_CLINIC_OR_DEPARTMENT_OTHER)
Admission: RE | Admit: 2022-01-01 | Discharge: 2022-01-01 | Disposition: A | Discharge status: Home / Self Care | Payer: PPO | Source: Ambulatory Visit | Attending: Physician Assistant | Admitting: Physician Assistant

## 2022-01-01 ENCOUNTER — Ambulatory Visit (INDEPENDENT_AMBULATORY_CARE_PROVIDER_SITE_OTHER): Payer: PPO | Admitting: Physician Assistant

## 2022-01-01 VITALS — BP 119/74 | HR 66 | Temp 98.0°F | Ht 63.0 in | Wt 141.4 lb

## 2022-01-01 DIAGNOSIS — R2241 Localized swelling, mass and lump, right lower limb: Secondary | ICD-10-CM

## 2022-01-01 DIAGNOSIS — M79661 Pain in right lower leg: Secondary | ICD-10-CM | POA: Diagnosis not present

## 2022-01-01 DIAGNOSIS — M7989 Other specified soft tissue disorders: Secondary | ICD-10-CM | POA: Diagnosis not present

## 2022-01-01 NOTE — Progress Notes (Signed)
Breanna Prince is a 71 y.o. female here for a new problem.  History of Present Illness:   Chief Complaint  Patient presents with   Leg Pain    Pt c/o mild pain and swelling in right leg, purple bruising. Pt states she was on a 5 hour flight on 6/13 and leg has been like that since. Pt states she sat the whole flight. Has not taken anything for the pain.     HPI  Leg pain Patient was recently on a flight back from [place], [place]. She had a total of 3 hours of flight, broken up by a layover. When returning on Tuesday, she noted swelling in b/l LE. She also noticed swelling/bruising to the front of her lower R leg. B/l LE swelling has improved with time but the area of swelling and bruising to the front of her lower R leg has not.   Denies: chest pain, SOB, palpitations, lightheadedness, numbness/tingling in feet, fevers/chills/discharge from area  She reports that a few years ago she "coughed up a blood clot". She was never started on any medication for this and she stated that the provider she saw for this was unconcerned at the time.  Past Medical History:  Diagnosis Date   Anxiety    Depression    Hyperlipidemia    Tinnitus of both ears    Vaginal delivery    1976, 1977, 1980, 1982     Social History   Tobacco Use   Smoking status: Former    Packs/day: 0.50    Types: Cigarettes    Quit date: 1985    Years since quitting: 38.4   Smokeless tobacco: Never  Vaping Use   Vaping Use: Never used  Substance Use Topics   Alcohol use: Yes    Comment: Occasional Drink   Drug use: Not Currently    Types: Marijuana    History reviewed. No pertinent surgical history.  Family History  Problem Relation Age of Onset   Ovarian cancer Mother    Congestive Heart Failure Father     Allergies  Allergen Reactions   Latex Itching    Current Medications:   Current Outpatient Medications:    Alpha-Lipoic Acid 600 MG CAPS, Take 2 capsules by mouth daily in the afternoon., Disp: ,  Rfl:    ALPRAZolam (XANAX) 1 MG tablet, Take 1 tablet (1 mg total) by mouth at bedtime as needed for anxiety., Disp: 30 tablet, Rfl: 0   Magnesium 400 MG CAPS, 1 tablet, Disp: , Rfl:    Multiple Vitamins-Minerals (WOMENS 50+ MULTI VITAMIN/MIN PO), , Disp: , Rfl:    Omega-3 Fatty Acids (OMEGA-3 FISH OIL PO), Take 1,380 mg by mouth daily in the afternoon., Disp: , Rfl:    sertraline (ZOLOFT) 100 MG tablet, Take 1 tablet (100 mg total) by mouth daily., Disp: 90 tablet, Rfl: 1   Review of Systems:   ROS Negative unless otherwise specified per HPI.  Vitals:   Vitals:   01/01/22 0932  BP: 119/74  Pulse: 66  Temp: 98 F (36.7 C)  TempSrc: Temporal  SpO2: 96%  Weight: 141 lb 6 oz (64.1 kg)  Height: '5\' 3"'$  (1.6 m)     Body mass index is 25.04 kg/m.  Physical Exam:   Physical Exam Vitals and nursing note reviewed.  Constitutional:      General: She is not in acute distress.    Appearance: She is well-developed. She is not ill-appearing or toxic-appearing.  Cardiovascular:  Rate and Rhythm: Normal rate and regular rhythm.     Pulses: Normal pulses.          Dorsalis pedis pulses are 2+ on the right side and 2+ on the left side.       Posterior tibial pulses are 2+ on the right side and 2+ on the left side.     Heart sounds: Normal heart sounds, S1 normal and S2 normal.  Pulmonary:     Effort: Pulmonary effort is normal.     Breath sounds: Normal breath sounds.  Musculoskeletal:     Comments: Erythema and mild swelling present to the anterior aspect of R shin. TTP.  No calf tenderness/swelling  Skin:    General: Skin is warm and dry.  Neurological:     Mental Status: She is alert.     GCS: GCS eye subscore is 4. GCS verbal subscore is 5. GCS motor subscore is 6.     Comments: Normal sensation to b/l lower extremity   Psychiatric:        Speech: Speech normal.        Behavior: Behavior normal. Behavior is cooperative.     Assessment and Plan:   Localized swelling of  right lower extremity Suspect possible superficial thrombophlebitis vs hematoma Will obtain u/s to r/o DVT Follow-up based on results No obvious signs of infection or need for abx at this time   Inda Coke, PA-C

## 2022-01-01 NOTE — Patient Instructions (Addendum)
It was great to see you!  Please attend your ultrasound appointment and we will be in touch with the results.   Because we are not able to get your ultrasound right away -- if you develop any worsening symptoms, such as chest pain, shortness of breath, or worsening symptoms - please go to the ER.  Take care,  Inda Coke PA-C

## 2022-01-15 ENCOUNTER — Ambulatory Visit (INDEPENDENT_AMBULATORY_CARE_PROVIDER_SITE_OTHER): Payer: PPO

## 2022-01-15 DIAGNOSIS — Z0000 Encounter for general adult medical examination without abnormal findings: Secondary | ICD-10-CM

## 2022-01-15 NOTE — Progress Notes (Addendum)
Virtual Visit via Telephone Note  I connected with  Breanna Prince on 01/15/22 at 10:15 AM EDT by telephone and verified that I am speaking with the correct person using two identifiers.  Medicare Annual Wellness visit completed telephonically due to Covid-19 pandemic.   Persons participating in this call: This Health Coach and this patient.   Location: Patient: Home Provider: Office    I discussed the limitations, risks, security and privacy concerns of performing an evaluation and management service by telephone and the availability of in person appointments. The patient expressed understanding and agreed to proceed.  Unable to perform video visit due to video visit attempted and failed and/or patient does not have video capability.   Some vital signs may be absent or patient reported.   Willette Brace, LPN   Subjective:   Breanna Prince is a 71 y.o. female who presents for an Initial Medicare Annual Wellness Visit.  Review of Systems     Cardiac Risk Factors include: advanced age (>37mn, >>1women);dyslipidemia     Objective:    There were no vitals filed for this visit. There is no height or weight on file to calculate BMI.     01/15/2022   10:21 AM 12/26/2020    7:55 AM  Advanced Directives  Does Patient Have a Medical Advance Directive? Yes Yes  Type of Advance Directive Healthcare Power of AGordonwill  CSierravillein Chart? No - copy requested     Current Medications (verified) Outpatient Encounter Medications as of 01/15/2022  Medication Sig   Alpha-Lipoic Acid 600 MG CAPS Take 2 capsules by mouth daily in the afternoon.   ALPRAZolam (XANAX) 1 MG tablet Take 1 tablet (1 mg total) by mouth at bedtime as needed for anxiety.   Magnesium 400 MG CAPS 1 tablet   Multiple Vitamins-Minerals (WOMENS 50+ MULTI VITAMIN/MIN PO)    Omega-3 Fatty Acids (OMEGA-3 FISH OIL PO) Take 1,380 mg by mouth daily in the afternoon.   sertraline  (ZOLOFT) 100 MG tablet Take 1 tablet (100 mg total) by mouth daily.   No facility-administered encounter medications on file as of 01/15/2022.    Allergies (verified) Latex   History: Past Medical History:  Diagnosis Date   Anxiety    Depression    Hyperlipidemia    Tinnitus of both ears    Vaginal delivery    1976, 1977, 1980, 1982   History reviewed. No pertinent surgical history. Family History  Problem Relation Age of Onset   Ovarian cancer Mother    Congestive Heart Failure Father    Social History   Socioeconomic History   Marital status: Married    Spouse name: Not on file   Number of children: 3   Years of education: Not on file   Highest education level: Not on file  Occupational History   Not on file  Tobacco Use   Smoking status: Former    Packs/day: 0.50    Types: Cigarettes    Quit date: 127   Years since quitting: 38.5   Smokeless tobacco: Never  Vaping Use   Vaping Use: Never used  Substance and Sexual Activity   Alcohol use: Yes    Comment: Occasional Drink   Drug use: Not Currently    Types: Marijuana   Sexual activity: Yes    Birth control/protection: None  Other Topics Concern   Not on file  Social History Narrative   Lives with husband  Retired from [place] [employer] Strain: [employer]  (01/15/2022)   Overall Financial Resource Strain (CARDIA)    Difficulty of Paying Living Expenses: Not hard at all  Food Insecurity: No Food Insecurity (01/15/2022)   Hunger Vital Sign    Worried About Running Out of Food in the Last Year: Never true    [place] in the Last Year: Never true  Transportation Needs: No Transportation Needs (01/15/2022)   PRAPARE - Hydrologist (Medical): No    Lack of Transportation (Non-Medical): No  Physical Activity: Sufficiently Active (01/15/2022)   Exercise Vital Sign    Days of Exercise per Week: 5 days    Minutes of Exercise per  Session: 60 min  Stress: No Stress Concern Present (01/15/2022)   [place]    Feeling of Stress : Not at all  Social Connections: Moderately Isolated (01/15/2022)   Social Connection and Isolation Panel [NHANES]    Frequency of Communication with Friends and Family: More than three times a week    Frequency of Social Gatherings with Friends and Family: More than three times a week    Attends Religious Services: Never    Marine scientist or Organizations: No    Attends Music therapist: Never    Marital Status: Married    Tobacco Counseling Counseling given: Not Answered   Clinical Intake:  Pre-visit preparation completed: Yes  Pain : No/denies pain     BMI - recorded: 25.05 Nutritional Risks: None Diabetes: No  How often do you need to have someone help you when you read instructions, pamphlets, or other written materials from your doctor or pharmacy?: 1 - Never  Diabetic?no  Interpreter Needed?: No  Information entered by :: Charlott Rakes, LPN   Activities of Daily Living    01/15/2022   10:21 AM  In your present state of health, do you have any difficulty performing the following activities:  Hearing? 0  Vision? 0  Difficulty concentrating or making decisions? 0  Walking or climbing stairs? 0  Dressing or bathing? 0  Doing errands, shopping? 0  Preparing Food and eating ? N  Using the Toilet? N  In the past six months, have you accidently leaked urine? N  Do you have problems with loss of bowel control? N  Managing your Medications? N  Managing your Finances? N  Housekeeping or managing your Housekeeping? N    Patient Care Team: Inda Coke, [place] as PCP - General (Physician Assistant)  Indicate any recent Medical Services you may have received from other than [facility] providers in the past year (date may be approximate).     Assessment:   This is a routine wellness  examination for Breanna Prince.  Hearing/Vision screen Hearing Screening - Comments:: Pt wears hearing aids  Vision Screening - Comments:: Pt [facility]   Dietary issues and exercise activities discussed: Current Exercise Habits: Home exercise routine, Type of exercise: walking;Other - see comments, Time (Minutes): 60, Frequency (Times/Week): 5, Weekly Exercise (Minutes/Week): 300   Goals Addressed             This Visit's Progress    Patient Stated       Working on eating better       Depression Screen    01/15/2022   10:20 AM 12/15/2021    9:06 AM  PHQ 2/9  Scores  PHQ - 2 Score 0 2  PHQ- 9 Score  7    Fall Risk    01/15/2022   10:21 AM 12/15/2021    8:54 AM 12/26/2020    7:55 AM  Fall Risk   Falls in the past year? 0 0 1  Number falls in past yr: 0 0 0  Injury with Fall? 0 0 1  Risk for fall due to : Impaired vision    Follow up Falls prevention discussed Falls evaluation completed     FALL RISK PREVENTION PERTAINING TO THE HOME:  Any stairs in or around the home? No  If so, are there any without handrails? No  Home free of loose throw rugs in walkways, pet beds, electrical cords, etc? Yes  Adequate lighting in your home to reduce risk of falls? Yes   ASSISTIVE DEVICES UTILIZED TO PREVENT FALLS:  Life alert? No  Use of a cane, walker or w/c? No  Grab bars in the bathroom? Yes  Shower chair or bench in shower? Yes  Elevated toilet seat or a handicapped toilet? No   TIMED UP AND GO:  Was the test performed? No .   Cognitive Function:        01/15/2022   10:22 AM  6CIT Screen  What Year? 0 points  What month? 0 points  What time? 0 points  Count back from 20 0 points  Months in reverse 0 points  Repeat phrase 0 points  Total Score 0 points    Immunizations Immunization History  Administered Date(s) Administered   Influenza,inj,Quad PF,6+ Mos 04/19/2017, 04/24/2018, 04/17/2020, 04/28/2021   Influenza-Unspecified 04/19/2017    PFIZER(Purple Top)SARS-COV-2 Vaccination 08/21/2019, 09/14/2019, 04/22/2020, 10/25/2020   Pneumococcal Conjugate-13 04/24/2018   Pneumococcal Polysaccharide-23 05/18/2019   Tdap 04/19/2017    TDAP status: Up to date  Flu Vaccine status: Up to date  Pneumococcal vaccine status: Up to date  Covid-19 vaccine status: Completed vaccines  Qualifies for Shingles Vaccine? Yes   Zostavax completed No   Shingrix Completed?: No.    Education has been provided regarding the importance of this vaccine. Patient has been advised to call insurance company to determine out of pocket expense if they have not yet received this vaccine. Advised may also receive vaccine at local pharmacy or Health Dept. Verbalized acceptance and understanding.  Screening Tests Health Maintenance  Topic Date Due   Hepatitis C Screening  Never done   COVID-19 Vaccine (5 - Pfizer series) 12/16/2022 (Originally 12/20/2020)   INFLUENZA VACCINE  02/16/2022   COLONOSCOPY (Pts 45-52yr Insurance coverage will need to be confirmed)  03/08/2023   MAMMOGRAM  06/23/2023   TETANUS/TDAP  04/20/2027   Pneumonia Vaccine 71 Years old  Completed   DEXA SCAN  Completed   HPV VACCINES  Aged Out   Zoster Vaccines- Shingrix  Discontinued    Health Maintenance  Health Maintenance Due  Topic Date Due   Hepatitis C Screening  Never done    Colorectal cancer screening: Type of screening: Colonoscopy. Completed 03/07/13. Repeat every 10 years  Mammogram status: Completed 06/22/21. Repeat every year  Bone Density status: Completed 04/13/21. Results reflect: Bone density results: OSTEOPENIA. Repeat every 2 years.   Additional Screening:  Hepatitis C Screening: does qualify;  Vision Screening: Recommended annual ophthalmology exams for early detection of glaucoma and other disorders of the eye. Is the patient up to date with their annual eye exam?  Yes  Who is the provider or what is the name of  the office in which the patient attends  annual eye exams? [facility]  If pt is not established with a provider, would they like to be referred to a provider to establish care? No .   Dental Screening: Recommended annual dental exams for proper oral hygiene  Community Resource Referral / Chronic Care Management: CRR required this visit?  No   CCM required this visit?  No      Plan:     I have personally reviewed and noted the following in the patient's chart:   Medical and social history Use of alcohol, tobacco or illicit drugs  Current medications and supplements including opioid prescriptions. Patient is not currently taking opioid prescriptions. Functional ability and status Nutritional status Physical activity Advanced directives List of other physicians Hospitalizations, surgeries, and ER visits in previous 12 months Vitals Screenings to include cognitive, depression, and falls Referrals and appointments  In addition, I have reviewed and discussed with patient certain preventive protocols, quality metrics, and best practice recommendations. A written personalized care plan for preventive services as well as general preventive health recommendations were provided to patient.     Willette Brace, LPN   01/14/6380   Nurse Notes: None

## 2022-01-15 NOTE — Patient Instructions (Signed)
Breanna Prince , Thank you for taking time to come for your Medicare Wellness Visit. I appreciate your ongoing commitment to your health goals. Please review the following plan we discussed and let me know if I can assist you in the future.   Screening recommendations/referrals: Colonoscopy: completed 03/07/13 repeat every 10 years  Mammogram: Done 06/22/21 repeat every year  Bone Density: done 04/13/21 repeat every year  Recommended yearly ophthalmology/optometry visit for glaucoma screening and checkup Recommended yearly dental visit for hygiene and checkup  Vaccinations: Influenza vaccine: done 04/28/21 repeat every year  Pneumococcal vaccine: Up to date Tdap vaccine: Done 04/19/17 repeat every 10 years  Shingles vaccine: Shingrix discussed. Please contact your pharmacy for coverage information.    Covid-19:Completed 2/2, 2/26, 04/22/20 & 10/25/20  Advanced directives: Please bring a copy of your health care power of attorney and living will to the office at your convenience.  Conditions/risks identified: working on eating better  Next appointment: Follow up in one year for your annual wellness visit    Preventive Care 65 Years and Older, Female Preventive care refers to lifestyle choices and visits with your health care provider that can promote health and wellness. What does preventive care include? A yearly physical exam. This is also called an annual well check. Dental exams once or twice a year. Routine eye exams. Ask your health care provider how often you should have your eyes checked. Personal lifestyle choices, including: Daily care of your teeth and gums. Regular physical activity. Eating a healthy diet. Avoiding tobacco and drug use. Limiting alcohol use. Practicing safe sex. Taking low-dose aspirin every day. Taking vitamin and mineral supplements as recommended by your health care provider. What happens during an annual well check? The services and screenings done by your  health care provider during your annual well check will depend on your age, overall health, lifestyle risk factors, and family history of disease. Counseling  Your health care provider may ask you questions about your: Alcohol use. Tobacco use. Drug use. Emotional well-being. Home and relationship well-being. Sexual activity. Eating habits. History of falls. Memory and ability to understand (cognition). Work and work Statistician. Reproductive health. Screening  You may have the following tests or measurements: Height, weight, and BMI. Blood pressure. Lipid and cholesterol levels. These may be checked every 5 years, or more frequently if you are over 22 years old. Skin check. Lung cancer screening. You may have this screening every year starting at age 88 if you have a 30-pack-year history of smoking and currently smoke or have quit within the past 15 years. Fecal occult blood test (FOBT) of the stool. You may have this test every year starting at age 75. Flexible sigmoidoscopy or colonoscopy. You may have a sigmoidoscopy every 5 years or a colonoscopy every 10 years starting at age 51. Hepatitis C blood test. Hepatitis B blood test. Sexually transmitted disease (STD) testing. Diabetes screening. This is done by checking your blood sugar (glucose) after you have not eaten for a while (fasting). You may have this done every 1-3 years. Bone density scan. This is done to screen for osteoporosis. You may have this done starting at age 76. Mammogram. This may be done every 1-2 years. Talk to your health care provider about how often you should have regular mammograms. Talk with your health care provider about your test results, treatment options, and if necessary, the need for more tests. Vaccines  Your health care provider may recommend certain vaccines, such as: Influenza vaccine. This  is recommended every year. Tetanus, diphtheria, and acellular pertussis (Tdap, Td) vaccine. You may  need a Td booster every 10 years. Zoster vaccine. You may need this after age 97. Pneumococcal 13-valent conjugate (PCV13) vaccine. One dose is recommended after age 87. Pneumococcal polysaccharide (PPSV23) vaccine. One dose is recommended after age 37. Talk to your health care provider about which screenings and vaccines you need and how often you need them. This information is not intended to replace advice given to you by your health care provider. Make sure you discuss any questions you have with your health care provider. Document Released: 08/01/2015 Document Revised: 03/24/2016 Document Reviewed: 05/06/2015 Elsevier Interactive Patient Education  2017 [place] Prevention in the Home Falls can cause injuries. They can happen to people of all ages. There are many things you can do to make your home safe and to help prevent falls. What can I do on the outside of my home? Regularly fix the edges of walkways and driveways and fix any cracks. Remove anything that might make you trip as you walk through a door, such as a raised step or threshold. Trim any bushes or trees on the path to your home. Use bright outdoor lighting. Clear any walking paths of anything that might make someone trip, such as rocks or tools. Regularly check to see if handrails are loose or broken. Make sure that both sides of any steps have handrails. Any raised decks and porches should have guardrails on the edges. Have any leaves, snow, or ice cleared regularly. Use sand or salt on walking paths during winter. Clean up any spills in your garage right away. This includes oil or grease spills. What can I do in the bathroom? Use night lights. Install grab bars by the toilet and in the tub and shower. Do not use towel bars as grab bars. Use non-skid mats or decals in the tub or shower. If you need to sit down in the shower, use a plastic, non-slip stool. Keep the floor dry. Clean up any water that spills on  the floor as soon as it happens. Remove soap buildup in the tub or shower regularly. Attach bath mats securely with double-sided non-slip rug tape. Do not have throw rugs and other things on the floor that can make you trip. What can I do in the bedroom? Use night lights. Make sure that you have a light by your bed that is easy to reach. Do not use any sheets or blankets that are too big for your bed. They should not hang down onto the floor. Have a firm chair that has side arms. You can use this for support while you get dressed. Do not have throw rugs and other things on the floor that can make you trip. What can I do in the kitchen? Clean up any spills right away. Avoid walking on wet floors. Keep items that you use a lot in easy-to-reach places. If you need to reach something above you, use a strong step stool that has a grab bar. Keep electrical cords out of the way. Do not use floor polish or wax that makes floors slippery. If you must use wax, use non-skid floor wax. Do not have throw rugs and other things on the floor that can make you trip. What can I do with my stairs? Do not leave any items on the stairs. Make sure that there are handrails on both sides of the stairs and use them. Fix handrails that  are broken or loose. Make sure that handrails are as long as the stairways. Check any carpeting to make sure that it is firmly attached to the stairs. Fix any carpet that is loose or worn. Avoid having throw rugs at the top or bottom of the stairs. If you do have throw rugs, attach them to the floor with carpet tape. Make sure that you have a light switch at the top of the stairs and the bottom of the stairs. If you do not have them, ask someone to add them for you. What else can I do to help prevent falls? Wear shoes that: Do not have high heels. Have rubber bottoms. Are comfortable and fit you well. Are closed at the toe. Do not wear sandals. If you use a stepladder: Make sure  that it is fully opened. Do not climb a closed stepladder. Make sure that both sides of the stepladder are locked into place. Ask someone to hold it for you, if possible. Clearly mark and make sure that you can see: Any grab bars or handrails. First and last steps. Where the edge of each step is. Use tools that help you move around (mobility aids) if they are needed. These include: Canes. Walkers. Scooters. Crutches. Turn on the lights when you go into a dark area. Replace any light bulbs as soon as they burn out. Set up your furniture so you have a clear path. Avoid moving your furniture around. If any of your floors are uneven, fix them. If there are any pets around you, be aware of where they are. Review your medicines with your doctor. Some medicines can make you feel dizzy. This can increase your chance of falling. Ask your doctor what other things that you can do to help prevent falls. This information is not intended to replace advice given to you by your health care provider. Make sure you discuss any questions you have with your health care provider. Document Released: 05/01/2009 Document Revised: 12/11/2015 Document Reviewed: 08/09/2014 Elsevier Interactive Patient Education  2017 [employer].

## 2022-02-22 ENCOUNTER — Ambulatory Visit (INDEPENDENT_AMBULATORY_CARE_PROVIDER_SITE_OTHER): Payer: PPO | Admitting: Physician Assistant

## 2022-02-22 ENCOUNTER — Encounter: Payer: Self-pay | Admitting: Physician Assistant

## 2022-02-22 VITALS — BP 98/70 | HR 67 | Temp 97.7°F | Ht 63.0 in | Wt 138.4 lb

## 2022-02-22 DIAGNOSIS — F330 Major depressive disorder, recurrent, mild: Secondary | ICD-10-CM | POA: Diagnosis not present

## 2022-02-22 DIAGNOSIS — E782 Mixed hyperlipidemia: Secondary | ICD-10-CM

## 2022-02-22 DIAGNOSIS — M546 Pain in thoracic spine: Secondary | ICD-10-CM

## 2022-02-22 DIAGNOSIS — Z0001 Encounter for general adult medical examination with abnormal findings: Secondary | ICD-10-CM

## 2022-02-22 LAB — URINALYSIS, ROUTINE W REFLEX MICROSCOPIC
Bilirubin Urine: NEGATIVE
Hgb urine dipstick: NEGATIVE
Ketones, ur: NEGATIVE
Nitrite: NEGATIVE
Specific Gravity, Urine: 1.02 (ref 1.000–1.030)
Total Protein, Urine: NEGATIVE
Urine Glucose: NEGATIVE
Urobilinogen, UA: 0.2 (ref 0.0–1.0)
pH: 6 (ref 5.0–8.0)

## 2022-02-22 LAB — CBC WITH DIFFERENTIAL/PLATELET
Basophils Absolute: 0 10*3/uL (ref 0.0–0.1)
Basophils Relative: 0.6 % (ref 0.0–3.0)
Eosinophils Absolute: 0.1 10*3/uL (ref 0.0–0.7)
Eosinophils Relative: 1.2 % (ref 0.0–5.0)
HCT: 40.8 % (ref 36.0–46.0)
Hemoglobin: 13.6 g/dL (ref 12.0–15.0)
Lymphocytes Relative: 26.9 % (ref 12.0–46.0)
Lymphs Abs: 1.9 10*3/uL (ref 0.7–4.0)
MCHC: 33.2 g/dL (ref 30.0–36.0)
MCV: 82.9 fl (ref 78.0–100.0)
Monocytes Absolute: 0.4 10*3/uL (ref 0.1–1.0)
Monocytes Relative: 6.3 % (ref 3.0–12.0)
Neutro Abs: 4.5 10*3/uL (ref 1.4–7.7)
Neutrophils Relative %: 65 % (ref 43.0–77.0)
Platelets: 350 10*3/uL (ref 150.0–400.0)
RBC: 4.92 Mil/uL (ref 3.87–5.11)
RDW: 15.5 % (ref 11.5–15.5)
WBC: 7 10*3/uL (ref 4.0–10.5)

## 2022-02-22 NOTE — Patient Instructions (Signed)
It was great to see you!  If your back pain worsens, please let me know. We will be checking your kidney function and urine tests today.  Please go to the lab for blood work.   Our office will call you with your results unless you have chosen to receive results via MyChart.  If your blood work is normal we will follow-up each year for physicals and as scheduled for chronic medical problems.  If anything is abnormal we will treat accordingly and get you in for a follow-up.  Take care,  Aldona Bar

## 2022-02-22 NOTE — Progress Notes (Signed)
Subjective:    Breanna Prince is a 71 y.o. female and is here for a comprehensive physical exam.   HPI  Health Maintenance Due  Topic Date Due   Hepatitis C Screening  Never done   INFLUENZA VACCINE  02/16/2022    Acute Concerns: Back Pain Patient complain of back pain that has been onset for 3 days.  Located on bilateral thoracic back. She rates her pain 7 out 10 this morning. States she has been working out recently and thinks this could be contributing. Has tried sleeping in different beds. This does not helped. Has had pain when walking. No urinary symptoms. No chest pan or SOB. No radiating pain. Denies: saddle anesthesia, loss of bowel/bladder control.  Chronic Issues: Depression She is currently taking Zoloft 100 mg daily and Xanax 1 mg once a week. States he has had more crying spells recently. Feels like her symptoms were well controlled. She would not like to see talk therapist at this time. She has been doing well with this regimen and denies any side effects. Denies SI/HI.   Health Maintenance: Immunizations -- UTD Colonoscopy -- UTD Mammogram -- UTD PAP -- UTD Bone Density -- UTD Diet -- Balanced diet -High protein and low sugar diet  Sleep habits -- No concern  Exercise -- Goes to gym and walks regularly.  Weight -- 187 Ib (62.8 kg) Mood -- Stable  Weight history: Wt Readings from Last 10 Encounters:  02/22/22 138 lb 6.4 oz (62.8 kg)  01/01/22 141 lb 6 oz (64.1 kg)  12/15/21 141 lb (64 kg)  12/26/20 138 lb (62.6 kg)   Body mass index is 24.52 kg/m. No LMP recorded. Patient is postmenopausal. Alcohol use:  reports current alcohol use. Tobacco use: None  Tobacco Use: Medium Risk (02/22/2022)   Patient History    Smoking Tobacco Use: Former    Smokeless Tobacco Use: Never    Passive Exposure: Not on file        02/22/2022    9:28 AM  Depression screen PHQ 2/9  Decreased Interest 1  Down, Depressed, Hopeless 1  PHQ - 2 Score 2  Altered sleeping  1  Tired, decreased energy 0  Change in appetite 0  Feeling bad or failure about yourself  1  Trouble concentrating 0  Moving slowly or fidgety/restless 0  Suicidal thoughts 0  PHQ-9 Score 4  Difficult doing work/chores Somewhat difficult     Other providers/specialists: Patient Care Team: Inda Coke, [place] as PCP - General (Physician Assistant)    PMHx, SurgHx, SocialHx, Medications, and Allergies were reviewed in the Visit Navigator and updated as appropriate.   Past Medical History:  Diagnosis Date   Anxiety    Depression    Hyperlipidemia    Tinnitus of both ears    Vaginal delivery    1976, 1977, 1980, 1982    History reviewed. No pertinent surgical history.   Family History  Problem Relation Age of Onset   Ovarian cancer Mother    Congestive Heart Failure Father     Social History   Tobacco Use   Smoking status: Former    Packs/day: 0.50    Types: Cigarettes    Quit date: 1985    Years since quitting: 38.6   Smokeless tobacco: Never  Vaping Use   Vaping Use: Never used  Substance Use Topics   Alcohol use: Yes    Comment: Occasional Drink   Drug use: Not Currently    Types: Marijuana  Review of Systems:   Review of Systems  Constitutional:  Negative for chills, fever, malaise/fatigue and weight loss.  HENT:  Negative for hearing loss, sinus pain and sore throat.   Respiratory:  Negative for cough, hemoptysis and shortness of breath.   Cardiovascular:  Negative for chest pain, palpitations, orthopnea, claudication, leg swelling and PND.  Gastrointestinal:  Negative for abdominal pain, constipation, diarrhea, heartburn, nausea and vomiting.  Genitourinary:  Negative for dysuria, frequency and urgency.  Musculoskeletal:  Positive for back pain. Negative for myalgias and neck pain.  Skin:  Negative for itching and rash.  Neurological:  Negative for dizziness, tingling, seizures and headaches.  Endo/Heme/Allergies:  Negative for polydipsia.   Psychiatric/Behavioral:  Negative for depression. The patient is not nervous/anxious.      Objective:   BP 98/70   Pulse 67   Temp 97.7 F (36.5 C)   Ht '5\' 3"'$  (1.6 m)   Wt 138 lb 6.4 oz (62.8 kg)   SpO2 98%   BMI 24.52 kg/m  Body mass index is 24.52 kg/m.   General Appearance:    Alert, cooperative, no distress, appears stated age  Head:    Normocephalic, without obvious abnormality, atraumatic  Eyes:    PERRL, conjunctiva/corneas clear, EOM's intact, fundi    benign, both eyes  Ears:    Normal TM's and external ear canals, both ears  Nose:   Nares normal, septum midline, mucosa normal, no drainage    or sinus tenderness  Throat:   Lips, mucosa, and tongue normal; teeth and gums normal  Neck:   Supple, symmetrical, trachea midline, no adenopathy;    thyroid:  no enlargement/tenderness/nodules; no carotid   bruit or JVD  Back:     Symmetric, no curvature, ROM normal Tenderness to b/l thoracic paraspinal muscles -- no bony tenderness  Lungs:     Clear to auscultation bilaterally, respirations unlabored  Chest Wall:    No tenderness or deformity   Heart:    Regular rate and rhythm, S1 and S2 normal, no murmur, rub or gallop  Breast Exam:    Deferred  Abdomen:     Soft, non-tender, bowel sounds active all four quadrants,    no masses, no organomegaly  Genitalia:    Deferred  Extremities:   Extremities normal, atraumatic, no cyanosis or edema  Pulses:   2+ and symmetric all extremities  Skin:   Skin color, texture, turgor normal, no rashes or lesions  Lymph nodes:   Cervical, supraclavicular, and axillary nodes normal  Neurologic:   CNII-XII intact, normal strength, sensation and reflexes    throughout    Assessment/Plan:   Encounter for general adult medical examination with abnormal findings Today patient counseled on age appropriate routine health concerns for screening and prevention, each reviewed and up to date or declined. Immunizations reviewed and up to date or  declined. Labs ordered and reviewed. Risk factors for depression reviewed and negative. Hearing function and visual acuity are intact. ADLs screened and addressed as needed. Functional ability and level of safety reviewed and appropriate. Education, counseling and referrals performed based on assessed risks today. Patient provided with a copy of personalized plan for preventive services.  Mild recurrent major depression (HCC) Overall controlled per patient She declines need for medication adjustment of zoloft 100 mg daily or initiation of talk therapy Follow-up in 6 mo to 1 yr, sooner if concerns I discussed with patient that if they develop any SI, to tell someone immediately and seek medical attention.  Mixed hyperlipidemia Recheck lipid panel and make recommendations accordingly  Acute bilateral thoracic back pain No red flags Will check UA and update kidney function Consider NSAIDs, push fluids, continue water aerobics unless aggravating Follow-up prn -- specifically in two weeks if no better, sooner if worsening  Patient Counseling: '[x]'$    Nutrition: Stressed importance of moderation in sodium/caffeine intake, saturated fat and cholesterol, caloric balance, sufficient intake of fresh fruits, vegetables, fiber, calcium, iron, and 1 mg of folate supplement per day (for females capable of pregnancy).  '[x]'$    Stressed the importance of regular exercise.   '[x]'$    Substance Abuse: Discussed cessation/primary prevention of tobacco, alcohol, or other drug use; driving or other dangerous activities under the influence; availability of treatment for abuse.   '[x]'$    Injury prevention: Discussed safety belts, safety helmets, smoke detector, smoking near bedding or upholstery.   '[x]'$    Sexuality: Discussed sexually transmitted diseases, partner selection, use of condoms, avoidance of unintended pregnancy  and contraceptive alternatives.  '[x]'$    Dental health: Discussed importance of regular tooth brushing,  flossing, and dental visits.  '[x]'$    Health maintenance and immunizations reviewed. Please refer to Health maintenance section.    I,Savera Zaman,acting as a Education administrator for [employer], PA.,have documented all relevant documentation on the behalf of Inda Coke, PA,as directed by  Inda Coke, PA while in the presence of Inda Coke, [place].    I, Inda Coke, [place], have reviewed all documentation for this visit. The documentation on 02/22/22 for the exam, diagnosis, procedures, and orders are all accurate and complete.   Inda Coke, PA-C [place]

## 2022-02-23 ENCOUNTER — Encounter: Payer: Self-pay | Admitting: Physician Assistant

## 2022-02-23 LAB — COMPREHENSIVE METABOLIC PANEL
ALT: 12 U/L (ref 0–35)
AST: 18 U/L (ref 0–37)
Albumin: 4.4 g/dL (ref 3.5–5.2)
Alkaline Phosphatase: 77 U/L (ref 39–117)
BUN: 24 mg/dL — ABNORMAL HIGH (ref 6–23)
CO2: 25 mEq/L (ref 19–32)
Calcium: 9.3 mg/dL (ref 8.4–10.5)
Chloride: 103 mEq/L (ref 96–112)
Creatinine, Ser: 0.98 mg/dL (ref 0.40–1.20)
GFR: 59.04 mL/min — ABNORMAL LOW (ref >60.00)
Glucose, Bld: 84 mg/dL (ref 70–99)
Potassium: 4.1 mEq/L (ref 3.5–5.1)
Sodium: 143 mEq/L (ref 135–145)
Total Bilirubin: 0.3 mg/dL (ref 0.2–1.2)
Total Protein: 7.5 g/dL (ref 6.0–8.3)

## 2022-02-23 LAB — LIPID PANEL
Cholesterol: 258 mg/dL — ABNORMAL HIGH (ref 0–200)
HDL: 67.3 mg/dL (ref >39.00)
LDL Cholesterol: 156 mg/dL — ABNORMAL HIGH (ref 0–99)
NonHDL: 190.35
Total CHOL/HDL Ratio: 4
Triglycerides: 172 mg/dL — ABNORMAL HIGH (ref 0.0–149.0)
VLDL: 34.4 mg/dL (ref 0.0–40.0)

## 2022-02-24 ENCOUNTER — Other Ambulatory Visit: Payer: Self-pay | Admitting: Physician Assistant

## 2022-02-24 DIAGNOSIS — E782 Mixed hyperlipidemia: Secondary | ICD-10-CM

## 2022-02-24 MED ORDER — FLUCONAZOLE 150 MG PO TABS: 150.0000 mg | ORAL_TABLET | Freq: Once | ORAL | 0 refills | Status: AC

## 2022-03-23 ENCOUNTER — Ambulatory Visit (HOSPITAL_BASED_OUTPATIENT_CLINIC_OR_DEPARTMENT_OTHER)
Admission: RE | Admit: 2022-03-23 | Discharge: 2022-03-23 | Disposition: A | Discharge status: Home / Self Care | Payer: PPO | Source: Ambulatory Visit | Attending: Physician Assistant | Admitting: Physician Assistant

## 2022-03-23 ENCOUNTER — Encounter (HOSPITAL_BASED_OUTPATIENT_CLINIC_OR_DEPARTMENT_OTHER): Payer: Self-pay

## 2022-03-23 DIAGNOSIS — E782 Mixed hyperlipidemia: Secondary | ICD-10-CM | POA: Insufficient documentation

## 2022-03-24 ENCOUNTER — Encounter: Payer: Self-pay | Admitting: Physician Assistant

## 2022-04-12 ENCOUNTER — Encounter: Payer: Self-pay | Admitting: *Deleted

## 2022-05-04 DIAGNOSIS — D485 Neoplasm of uncertain behavior of skin: Secondary | ICD-10-CM | POA: Diagnosis not present

## 2022-05-04 DIAGNOSIS — Z85828 Personal history of other malignant neoplasm of skin: Secondary | ICD-10-CM | POA: Diagnosis not present

## 2022-05-04 DIAGNOSIS — L812 Freckles: Secondary | ICD-10-CM | POA: Diagnosis not present

## 2022-05-04 DIAGNOSIS — D0471 Carcinoma in situ of skin of right lower limb, including hip: Secondary | ICD-10-CM | POA: Diagnosis not present

## 2022-05-04 DIAGNOSIS — L820 Inflamed seborrheic keratosis: Secondary | ICD-10-CM | POA: Diagnosis not present

## 2022-05-04 DIAGNOSIS — L821 Other seborrheic keratosis: Secondary | ICD-10-CM | POA: Diagnosis not present

## 2022-06-01 ENCOUNTER — Other Ambulatory Visit (HOSPITAL_BASED_OUTPATIENT_CLINIC_OR_DEPARTMENT_OTHER): Payer: Self-pay | Admitting: Physician Assistant

## 2022-06-01 DIAGNOSIS — Z1231 Encounter for screening mammogram for malignant neoplasm of breast: Secondary | ICD-10-CM

## 2022-06-24 ENCOUNTER — Encounter (HOSPITAL_BASED_OUTPATIENT_CLINIC_OR_DEPARTMENT_OTHER): Payer: PPO | Admitting: Radiology

## 2022-06-24 DIAGNOSIS — Z1231 Encounter for screening mammogram for malignant neoplasm of breast: Secondary | ICD-10-CM

## 2022-06-25 ENCOUNTER — Ambulatory Visit (HOSPITAL_BASED_OUTPATIENT_CLINIC_OR_DEPARTMENT_OTHER)
Admission: RE | Admit: 2022-06-25 | Discharge: 2022-06-25 | Disposition: A | Discharge status: Home / Self Care | Payer: PPO | Source: Ambulatory Visit | Attending: Physician Assistant | Admitting: Physician Assistant

## 2022-06-25 DIAGNOSIS — Z1231 Encounter for screening mammogram for malignant neoplasm of breast: Secondary | ICD-10-CM | POA: Insufficient documentation

## 2022-07-05 ENCOUNTER — Encounter: Payer: Self-pay | Admitting: Physician Assistant

## 2022-07-27 DIAGNOSIS — M25571 Pain in right ankle and joints of right foot: Secondary | ICD-10-CM | POA: Diagnosis not present

## 2022-09-28 ENCOUNTER — Ambulatory Visit (INDEPENDENT_AMBULATORY_CARE_PROVIDER_SITE_OTHER): Payer: PPO | Admitting: Physician Assistant

## 2022-09-28 ENCOUNTER — Ambulatory Visit (INDEPENDENT_AMBULATORY_CARE_PROVIDER_SITE_OTHER)
Admission: RE | Admit: 2022-09-28 | Discharge: 2022-09-28 | Disposition: A | Discharge status: Home / Self Care | Payer: PPO | Source: Ambulatory Visit | Attending: Physician Assistant | Admitting: Physician Assistant

## 2022-09-28 ENCOUNTER — Encounter: Payer: Self-pay | Admitting: Physician Assistant

## 2022-09-28 VITALS — BP 110/70 | HR 68 | Temp 97.1°F | Ht 63.0 in | Wt 142.2 lb

## 2022-09-28 DIAGNOSIS — F330 Major depressive disorder, recurrent, mild: Secondary | ICD-10-CM | POA: Diagnosis not present

## 2022-09-28 DIAGNOSIS — H9319 Tinnitus, unspecified ear: Secondary | ICD-10-CM | POA: Diagnosis not present

## 2022-09-28 DIAGNOSIS — R059 Cough, unspecified: Secondary | ICD-10-CM | POA: Diagnosis not present

## 2022-09-28 DIAGNOSIS — R051 Acute cough: Secondary | ICD-10-CM | POA: Diagnosis not present

## 2022-09-28 MED ORDER — FLUTICASONE-SALMETEROL 100-50 MCG/ACT IN AEPB: 1.0000 | INHALATION_SPRAY | Freq: Two times a day (BID) | RESPIRATORY_TRACT | 3 refills | Status: DC

## 2022-09-28 MED ORDER — ALPRAZOLAM 1 MG PO TABS: 1.0000 mg | ORAL_TABLET | Freq: Every evening | ORAL | 0 refills | Status: DC | PRN

## 2022-09-28 NOTE — Progress Notes (Signed)
ADYLAN MOAD is a 72 y.o. female here for a new problem.  History of Present Illness:   Chief Complaint  Patient presents with   Cough    Pt c/o feeling like lung is irritated on the right side of chest, slight cough but no expectoration, has been going on for one month. Has been taking Mucinex.      Cough Symptom started a month ago, felt like it was allergies Typically suffers from allergies Has taken allergy meds in the past but not for this illness Patient reports that she has an "irritated right lung" -- does not radiate anywhere Taking mucinex without relief of sx Former smoker  Denies SOB, productive cough, ear pain, sore throat, fever, chills, recent travel, blood in cough   Depression; Tinnitus Taking zoloft 100 mg daily and xanax prn When tinnitus flares, she has to take her xanax Unfortunately the mucinex is causing tinnitus flare She needs refill on xanax today Denies falls  Past Medical History:  Diagnosis Date   Anxiety    Depression    Hyperlipidemia    Tinnitus of both ears    Vaginal delivery    1976, 1977, 1980, 1982     Social History   Tobacco Use   Smoking status: Former    Packs/day: 0.50    Years: 10.00    Total pack years: 5.00    Types: Cigarettes    Quit date: 07/20/1983    Years since quitting: 39.2   Smokeless tobacco: Never  Vaping Use   Vaping Use: Never used  Substance Use Topics   Alcohol use: Yes    Comment: Occasionally   Drug use: Not Currently    Types: Marijuana    History reviewed. No pertinent surgical history.  Family History  Problem Relation Age of Onset   Ovarian cancer Mother    Cancer Mother    Miscarriages / Stillbirths Mother    Congestive Heart Failure Father    Heart disease Father    Cancer Sister        squamous cell rectal cancer   Heart disease Sister    Heart disease Brother     Allergies  Allergen Reactions   Latex Itching    Current Medications:   Current Outpatient Medications:     Alpha-Lipoic Acid 600 MG CAPS, Take 2 capsules by mouth daily in the afternoon., Disp: , Rfl:    ALPRAZolam (XANAX) 1 MG tablet, Take 1 tablet (1 mg total) by mouth at bedtime as needed for anxiety., Disp: 30 tablet, Rfl: 0   Magnesium 400 MG CAPS, 1 tablet, Disp: , Rfl:    Multiple Vitamins-Minerals (WOMENS 50+ MULTI VITAMIN/MIN PO), , Disp: , Rfl:    Omega-3 Fatty Acids (OMEGA-3 FISH OIL PO), Take 1,380 mg by mouth daily in the afternoon., Disp: , Rfl:    sertraline (ZOLOFT) 100 MG tablet, Take 1 tablet (100 mg total) by mouth daily., Disp: 90 tablet, Rfl: 1   Review of Systems:   Review of Systems  Respiratory:  Positive for cough.    Negative unless otherwise specified per HPI.   Vitals:   Vitals:   09/28/22 1016  BP: 110/70  Pulse: 68  Temp: (!) 97.1 F (36.2 C)  TempSrc: Temporal  SpO2: 98%  Weight: 142 lb 4 oz (64.5 kg)  Height: '5\' 3"'$  (1.6 m)     Body mass index is 25.2 kg/m.  Physical Exam:   Physical Exam Vitals and nursing note reviewed.  Constitutional:  General: She is not in acute distress.    Appearance: She is well-developed. She is not ill-appearing or toxic-appearing.  HENT:     Head: Normocephalic and atraumatic.     Right Ear: Tympanic membrane, ear canal and external ear normal. Tympanic membrane is not erythematous, retracted or bulging.     Left Ear: Tympanic membrane, ear canal and external ear normal. Tympanic membrane is not erythematous, retracted or bulging.     Nose: Nose normal.     Right Sinus: No maxillary sinus tenderness or frontal sinus tenderness.     Left Sinus: No maxillary sinus tenderness or frontal sinus tenderness.     Mouth/Throat:     Pharynx: Uvula midline. No posterior oropharyngeal erythema.  Eyes:     General: Lids are normal.     Conjunctiva/sclera: Conjunctivae normal.  Neck:     Trachea: Trachea normal.  Cardiovascular:     Rate and Rhythm: Normal rate and regular rhythm.     Heart sounds: Normal heart  sounds, S1 normal and S2 normal.  Pulmonary:     Effort: Pulmonary effort is normal.     Breath sounds: Normal breath sounds. No decreased breath sounds, wheezing, rhonchi or rales.  Lymphadenopathy:     Cervical: No cervical adenopathy.  Skin:    General: Skin is warm and dry.  Neurological:     Mental Status: She is alert.  Psychiatric:        Speech: Speech normal.        Behavior: Behavior normal. Behavior is cooperative.     Assessment and Plan:   Acute cough No red flags I do not feel as though she needs abx at this time Will treat as reactive airway disease with the allergies - recommend daily antihistamine and ICS CXR given chronicity of sx If persists, will refer to pulm  Mild recurrent major depression ([place]) Well controlled Continue zoloft 100 mg daily Denies SI/HI Follow-up in 6 months, sooner if concerns  Tinnitus, unspecified laterality Overall controlled Continue xanax prn Follow-up as needed   Inda Coke, PA-C

## 2022-09-28 NOTE — Patient Instructions (Signed)
It was great to see you!  We are going to treat this as a reactive airway -- start daily antihistamine -- claritin, allegra, zyrtec - one of these, generic is fine Trial inhaler in AM and PM  An order for xray has been put in for you. To have this done, you can walk in at the [facility] location without a scheduled appointment.  The address is [address] It is across the street from [facility]. Lab and x-xray are located in the basement.   Hours of operation are M-F 8:30am to 5:00pm.  Please note that they are closed for lunch between 12:30 and 1:00pm.  If no better, or any worse, please let me know  Take care,  Inda Coke PA-C

## 2022-10-03 ENCOUNTER — Other Ambulatory Visit: Payer: Self-pay | Admitting: Physician Assistant

## 2022-11-08 DIAGNOSIS — L821 Other seborrheic keratosis: Secondary | ICD-10-CM | POA: Diagnosis not present

## 2022-11-08 DIAGNOSIS — Z85828 Personal history of other malignant neoplasm of skin: Secondary | ICD-10-CM | POA: Diagnosis not present

## 2022-11-08 DIAGNOSIS — L820 Inflamed seborrheic keratosis: Secondary | ICD-10-CM | POA: Diagnosis not present

## 2022-12-17 ENCOUNTER — Other Ambulatory Visit: Payer: Self-pay | Admitting: Physician Assistant

## 2022-12-20 MED ORDER — ALPRAZOLAM 1 MG PO TABS: 1.0000 mg | ORAL_TABLET | Freq: Every evening | ORAL | 0 refills | Status: DC | PRN

## 2022-12-20 NOTE — Telephone Encounter (Signed)
Last refill: 09/28/22 #30, 0 Last OV: 09/28/22 dx. Acute cough

## 2022-12-21 ENCOUNTER — Ambulatory Visit (INDEPENDENT_AMBULATORY_CARE_PROVIDER_SITE_OTHER): Payer: PPO

## 2022-12-21 VITALS — Wt 142.0 lb

## 2022-12-21 DIAGNOSIS — Z0000 Encounter for general adult medical examination without abnormal findings: Secondary | ICD-10-CM | POA: Diagnosis not present

## 2022-12-21 NOTE — Progress Notes (Signed)
I connected with  Breanna Prince on 12/21/22 by a audio enabled telemedicine application and verified that I am speaking with the correct person using two identifiers.  Patient Location: Home  Provider Location: Office/Clinic  I discussed the limitations of evaluation and management by telemedicine. The patient expressed understanding and agreed to proceed.   Patient Medicare AWV questionnaire was completed by the patient on 12/17/22; I have confirmed that all information answered by patient is correct and no changes since this date.     Subjective:   Breanna Prince is a 72 y.o. female who presents for Medicare Annual (Subsequent) preventive examination.  Review of Systems     Cardiac Risk Factors include: advanced age (>20men, >69 women);dyslipidemia     Objective:    Today's Vitals   12/21/22 1527  Weight: 142 lb (64.4 kg)   Body mass index is 25.15 kg/m.     12/21/2022    3:31 PM 01/15/2022   10:21 AM 12/26/2020    7:55 AM  Advanced Directives  Does Patient Have a Medical Advance Directive? Yes Yes Yes  Type of Estate agent of [place];Living will Healthcare Power of Attorney Living will  Copy of Healthcare Power of Attorney in Chart? No - copy requested No - copy requested     Current Medications (verified) Outpatient Encounter Medications as of 12/21/2022  Medication Sig   Alpha-Lipoic Acid 600 MG CAPS Take 2 capsules by mouth daily in the afternoon.   ALPRAZolam (XANAX) 1 MG tablet Take 1 tablet (1 mg total) by mouth at bedtime as needed for anxiety.   Magnesium 400 MG CAPS 1 tablet   Multiple Vitamins-Minerals (WOMENS 50+ MULTI VITAMIN/MIN PO)    Omega-3 Fatty Acids (OMEGA-3 FISH OIL PO) Take 1,380 mg by mouth daily in the afternoon.   sertraline (ZOLOFT) 100 MG tablet TAKE 1 TABLET BY MOUTH DAILY   [DISCONTINUED] fluticasone-salmeterol (ADVAIR) 100-50 MCG/ACT AEPB Inhale 1 puff into the lungs 2 (two) times daily.   No facility-administered  encounter medications on file as of 12/21/2022.    Allergies (verified) Latex   History: Past Medical History:  Diagnosis Date   Anxiety    Depression    Hyperlipidemia    Neuropathy 12/15/2021   Tinnitus of both ears    Vaginal delivery    1976, 1977, 1980, 1982   History reviewed. No pertinent surgical history. Family History  Problem Relation Age of Onset   Ovarian cancer Mother    Cancer Mother    Miscarriages / Stillbirths Mother    Congestive Heart Failure Father    Heart disease Father    Cancer Sister        squamous cell rectal cancer   Heart disease Sister    Heart disease Brother    Social History   Socioeconomic History   Marital status: Married    Spouse name: Not on file   Number of children: 3   Years of education: Not on file   Highest education level: Not on file  Occupational History   Not on file  Tobacco Use   Smoking status: Former    Packs/day: 0.50    Years: 10.00    Additional pack years: 0.00    Total pack years: 5.00    Types: Cigarettes    Quit date: 07/20/1983    Years since quitting: 39.4   Smokeless tobacco: Never  Vaping Use   Vaping Use: Never used  Substance and Sexual Activity   Alcohol use:  Yes    Comment: Occasionally   Drug use: Not Currently    Types: Marijuana   Sexual activity: Yes    Birth control/protection: None  Other Topics Concern   Not on file  Social History Narrative   Lives with husband   Retired from [employer]    Social Determinants of Health   Financial Resource Strain: Low Risk  (12/17/2022)   Overall Financial Resource Strain (CARDIA)    Difficulty of Paying Living Expenses: Not hard at all  Food Insecurity: No Food Insecurity (12/17/2022)   Hunger Vital Sign    Worried About Running Out of Food in the Last Year: Never true    Ran Out of Food in the Last Year: Never true  Transportation Needs: No Transportation Needs (12/17/2022)   PRAPARE - Administrator, Civil Service (Medical): No     Lack of Transportation (Non-Medical): No  Physical Activity: Insufficiently Active (12/17/2022)   Exercise Vital Sign    Days of Exercise per Week: 2 days    Minutes of Exercise per Session: 30 min  Stress: Stress Concern Present (12/17/2022)   [employer] of Occupational Health - Occupational Stress Questionnaire    Feeling of Stress : To some extent  Social Connections: Moderately Integrated (12/17/2022)   Social Connection and Isolation Panel [NHANES]    Frequency of Communication with Friends and Family: Once a week    Frequency of Social Gatherings with Friends and Family: Three times a week    Attends Religious Services: Never    Active Member of Clubs or Organizations: Yes    Attends Engineer, structural: More than 4 times per year    Marital Status: Married    Tobacco Counseling Counseling given: Not Answered   Clinical Intake:  Pre-visit preparation completed: Yes  Pain : No/denies pain     BMI - recorded: 25.15  How often do you need to have someone help you when you read instructions, pamphlets, or other written materials from your doctor or pharmacy?: 1 - Never  Diabetic?no  Interpreter Needed?: No  Information entered by :: Lanier Ensign, LPN   Activities of Daily Living    12/17/2022   10:13 AM 01/15/2022   10:21 AM  In your present state of health, do you have any difficulty performing the following activities:  Hearing? 0 0  Vision? 0 0  Difficulty concentrating or making decisions? 0 0  Walking or climbing stairs? 0 0  Dressing or bathing? 0 0  Doing errands, shopping? 0 0  Preparing Food and eating ? N N  Using the Toilet? N N  In the past six months, have you accidently leaked urine? Y N  Comment at times wears a pad   Do you have problems with loss of bowel control? N N  Managing your Medications? N N  Managing your Finances? N N  Housekeeping or managing your Housekeeping? N N    Patient Care Team: Jarold Motto, [place] as  PCP - General (Physician Assistant)  Indicate any recent Medical Services you may have received from other than [facility] providers in the past year (date may be approximate).     Assessment:   This is a routine wellness examination for Breanna Prince.  Hearing/Vision screen Hearing Screening - Comments:: Pt has hearing aids  Vision Screening - Comments:: [facility]   Dietary issues and exercise activities discussed: Current Exercise Habits: Home exercise routine, Type of exercise: walking, Time (Minutes): 30, Frequency (  Times/Week): 2, Weekly Exercise (Minutes/Week): 60   Goals Addressed             This Visit's Progress    Patient Stated       Start being more active        Depression Screen    12/21/2022    3:30 PM 09/28/2022   10:32 AM 02/22/2022    9:28 AM 01/15/2022   10:20 AM 12/15/2021    9:06 AM  PHQ 2/9 Scores  PHQ - 2 Score 0 0 2 0 2  PHQ- 9 Score  3 4  7     Fall Risk    12/17/2022   10:13 AM 02/22/2022    9:28 AM 01/15/2022   10:21 AM 12/15/2021    8:54 AM 12/26/2020    7:55 AM  Fall Risk   Falls in the past year? 0 0 0 0 1  Number falls in past yr:  0 0 0 0  Injury with Fall?  0 0 0 1  Risk for fall due to : Impaired vision No Fall Risks Impaired vision    Follow up Falls prevention discussed  Falls prevention discussed Falls evaluation completed     FALL RISK PREVENTION PERTAINING TO THE HOME:  Any stairs in or around the home? No  If so, are there any without handrails? No  Home free of loose throw rugs in walkways, pet beds, electrical cords, etc? Yes  Adequate lighting in your home to reduce risk of falls? Yes   ASSISTIVE DEVICES UTILIZED TO PREVENT FALLS:  Life alert? No  Use of a cane, walker or w/c? No  Grab bars in the bathroom? No  Shower chair or bench in shower? No  Elevated toilet seat or a handicapped toilet? Yes   TIMED UP AND GO:  Was the test performed? No .   Cognitive Function:        12/21/2022    3:33 PM  01/15/2022   10:22 AM  6CIT Screen  What Year? 0 points 0 points  What month? 0 points 0 points  What time? 0 points 0 points  Count back from 20 0 points 0 points  Months in reverse 0 points 0 points  Repeat phrase 0 points 0 points  Total Score 0 points 0 points    Immunizations Immunization History  Administered Date(s) Administered   COVID-19, mRNA, vaccine(Comirnaty)12 years and older 04/30/2022   Fluad Quad(high Dose 65+) 04/05/2022   Influenza,inj,Quad PF,6+ Mos 04/19/2017, 04/24/2018, 04/17/2020, 04/28/2021   Influenza-Unspecified 04/19/2017   PFIZER(Purple Top)SARS-COV-2 Vaccination 08/21/2019, 09/14/2019, 04/22/2020, 10/25/2020   Pneumococcal Conjugate-13 04/24/2018   Pneumococcal Polysaccharide-23 05/18/2019   Tdap 04/19/2017    TDAP status: Up to date  Flu Vaccine status: Up to date  Pneumococcal vaccine status: Up to date  Covid-19 vaccine status: Completed vaccines  Qualifies for Shingles Vaccine? No       Screening Tests Health Maintenance  Topic Date Due   Hepatitis C Screening  Never done   Colonoscopy  03/08/2023   INFLUENZA VACCINE  02/17/2023   Medicare Annual Wellness (AWV)  12/21/2023   MAMMOGRAM  06/25/2024   DTaP/Tdap/Td (2 - Td or Tdap) 04/20/2027   Pneumonia Vaccine 58+ Years old  Completed   DEXA SCAN  Completed   COVID-19 Vaccine  Completed   HPV VACCINES  Aged Out   Zoster Vaccines- Shingrix  Discontinued    Health Maintenance  Health Maintenance Due  Topic Date Due   Hepatitis C Screening  Never done   Colonoscopy  03/08/2023    Colorectal cancer screening: Type of screening: Colonoscopy. Completed 03/07/13. Repeat every 10 years  Mammogram status: Completed 06/25/22. Repeat every year  Bone Density status: Completed 04/13/21. Results reflect: Bone density results: OSTEOPENIA. Repeat every 2 years.  Additional Screening:  Hepatitis C Screening: does qualify;  Vision Screening: Recommended annual ophthalmology exams for  early detection of glaucoma and other disorders of the eye. Is the patient up to date with their annual eye exam?  Yes  Who is the provider or what is the name of the office in which the patient attends annual eye exams? [facility]  If pt is not established with a provider, would they like to be referred to a provider to establish care? No .   Dental Screening: Recommended annual dental exams for proper oral hygiene  Community Resource Referral / Chronic Care Management: CRR required this visit?  No   CCM required this visit?  No      Plan:     I have personally reviewed and noted the following in the patient's chart:   Medical and social history Use of alcohol, tobacco or illicit drugs  Current medications and supplements including opioid prescriptions. Patient is not currently taking opioid prescriptions. Functional ability and status Nutritional status Physical activity Advanced directives List of other physicians Hospitalizations, surgeries, and ER visits in previous 12 months Vitals Screenings to include cognitive, depression, and falls Referrals and appointments  In addition, I have reviewed and discussed with patient certain preventive protocols, quality metrics, and best practice recommendations. A written personalized care plan for preventive services as well as general preventive health recommendations were provided to patient.     Marzella Schlein, LPN   07/24/1094   Nurse Notes: none

## 2022-12-21 NOTE — Patient Instructions (Signed)
Breanna Prince , Thank you for taking time to come for your Medicare Wellness Visit. I appreciate your ongoing commitment to your health goals. Please review the following plan we discussed and let me know if I can assist you in the future.   These are the goals we discussed:  Goals      Patient Stated     Working on eating better     Patient Stated     Start being more active         This is a list of the screening recommended for you and due dates:  Health Maintenance  Topic Date Due   Hepatitis C Screening  Never done   Colon Cancer Screening  03/08/2023   Flu Shot  02/17/2023   Medicare Annual Wellness Visit  12/21/2023   Mammogram  06/25/2024   DTaP/Tdap/Td vaccine (2 - Td or Tdap) 04/20/2027   Pneumonia Vaccine  Completed   DEXA scan (bone density measurement)  Completed   COVID-19 Vaccine  Completed   HPV Vaccine  Aged Out   Zoster (Shingles) Vaccine  Discontinued    Advanced directives: Please bring a copy of your health care power of attorney and living will to the office at your convenience.  Conditions/risks identified: be more active   Next appointment: Follow up in one year for your annual wellness visit    Preventive Care 65 Years and Older, Female Preventive care refers to lifestyle choices and visits with your health care provider that can promote health and wellness. What does preventive care include? A yearly physical exam. This is also called an annual well check. Dental exams once or twice a year. Routine eye exams. Ask your health care provider how often you should have your eyes checked. Personal lifestyle choices, including: Daily care of your teeth and gums. Regular physical activity. Eating a healthy diet. Avoiding tobacco and drug use. Limiting alcohol use. Practicing safe sex. Taking low-dose aspirin every day. Taking vitamin and mineral supplements as recommended by your health care provider. What happens during an annual well check? The  services and screenings done by your health care provider during your annual well check will depend on your age, overall health, lifestyle risk factors, and family history of disease. Counseling  Your health care provider may ask you questions about your: Alcohol use. Tobacco use. Drug use. Emotional well-being. Home and relationship well-being. Sexual activity. Eating habits. History of falls. Memory and ability to understand (cognition). Work and work Astronomer. Reproductive health. Screening  You may have the following tests or measurements: Height, weight, and BMI. Blood pressure. Lipid and cholesterol levels. These may be checked every 5 years, or more frequently if you are over 50 years old. Skin check. Lung cancer screening. You may have this screening every year starting at age 51 if you have a 30-pack-year history of smoking and currently smoke or have quit within the past 15 years. Fecal occult blood test (FOBT) of the stool. You may have this test every year starting at age 41. Flexible sigmoidoscopy or colonoscopy. You may have a sigmoidoscopy every 5 years or a colonoscopy every 10 years starting at age 27. Hepatitis C blood test. Hepatitis B blood test. Sexually transmitted disease (STD) testing. Diabetes screening. This is done by checking your blood sugar (glucose) after you have not eaten for a while (fasting). You may have this done every 1-3 years. Bone density scan. This is done to screen for osteoporosis. You may have this done starting  at age 65. Mammogram. This may be done every 1-2 years. Talk to your health care provider about how often you should have regular mammograms. Talk with your health care provider about your test results, treatment options, and if necessary, the need for more tests. Vaccines  Your health care provider may recommend certain vaccines, such as: Influenza vaccine. This is recommended every year. Tetanus, diphtheria, and acellular  pertussis (Tdap, Td) vaccine. You may need a Td booster every 10 years. Zoster vaccine. You may need this after age 40. Pneumococcal 13-valent conjugate (PCV13) vaccine. One dose is recommended after age 88. Pneumococcal polysaccharide (PPSV23) vaccine. One dose is recommended after age 59. Talk to your health care provider about which screenings and vaccines you need and how often you need them. This information is not intended to replace advice given to you by your health care provider. Make sure you discuss any questions you have with your health care provider. Document Released: 08/01/2015 Document Revised: 03/24/2016 Document Reviewed: 05/06/2015 Elsevier Interactive Patient Education  2017 [employer].  Fall Prevention in the Home Falls can cause injuries. They can happen to people of all ages. There are many things you can do to make your home safe and to help prevent falls. What can I do on the outside of my home? Regularly fix the edges of walkways and driveways and fix any cracks. Remove anything that might make you trip as you walk through a door, such as a raised step or threshold. Trim any bushes or trees on the path to your home. Use bright outdoor lighting. Clear any walking paths of anything that might make someone trip, such as rocks or tools. Regularly check to see if handrails are loose or broken. Make sure that both sides of any steps have handrails. Any raised decks and porches should have guardrails on the edges. Have any leaves, snow, or ice cleared regularly. Use sand or salt on walking paths during winter. Clean up any spills in your garage right away. This includes oil or grease spills. What can I do in the bathroom? Use night lights. Install grab bars by the toilet and in the tub and shower. Do not use towel bars as grab bars. Use non-skid mats or decals in the tub or shower. If you need to sit down in the shower, use a plastic, non-slip stool. Keep the floor  dry. Clean up any water that spills on the floor as soon as it happens. Remove soap buildup in the tub or shower regularly. Attach bath mats securely with double-sided non-slip rug tape. Do not have throw rugs and other things on the floor that can make you trip. What can I do in the bedroom? Use night lights. Make sure that you have a light by your bed that is easy to reach. Do not use any sheets or blankets that are too big for your bed. They should not hang down onto the floor. Have a firm chair that has side arms. You can use this for support while you get dressed. Do not have throw rugs and other things on the floor that can make you trip. What can I do in the kitchen? Clean up any spills right away. Avoid walking on wet floors. Keep items that you use a lot in easy-to-reach places. If you need to reach something above you, use a strong step stool that has a grab bar. Keep electrical cords out of the way. Do not use floor polish or wax that makes  floors slippery. If you must use wax, use non-skid floor wax. Do not have throw rugs and other things on the floor that can make you trip. What can I do with my stairs? Do not leave any items on the stairs. Make sure that there are handrails on both sides of the stairs and use them. Fix handrails that are broken or loose. Make sure that handrails are as long as the stairways. Check any carpeting to make sure that it is firmly attached to the stairs. Fix any carpet that is loose or worn. Avoid having throw rugs at the top or bottom of the stairs. If you do have throw rugs, attach them to the floor with carpet tape. Make sure that you have a light switch at the top of the stairs and the bottom of the stairs. If you do not have them, ask someone to add them for you. What else can I do to help prevent falls? Wear shoes that: Do not have high heels. Have rubber bottoms. Are comfortable and fit you well. Are closed at the toe. Do not wear  sandals. If you use a stepladder: Make sure that it is fully opened. Do not climb a closed stepladder. Make sure that both sides of the stepladder are locked into place. Ask someone to hold it for you, if possible. Clearly mark and make sure that you can see: Any grab bars or handrails. First and last steps. Where the edge of each step is. Use tools that help you move around (mobility aids) if they are needed. These include: Canes. Walkers. Scooters. Crutches. Turn on the lights when you go into a dark area. Replace any light bulbs as soon as they burn out. Set up your furniture so you have a clear path. Avoid moving your furniture around. If any of your floors are uneven, fix them. If there are any pets around you, be aware of where they are. Review your medicines with your doctor. Some medicines can make you feel dizzy. This can increase your chance of falling. Ask your doctor what other things that you can do to help prevent falls. This information is not intended to replace advice given to you by your health care provider. Make sure you discuss any questions you have with your health care provider. Document Released: 05/01/2009 Document Revised: 12/11/2015 Document Reviewed: 08/09/2014 Elsevier Interactive Patient Education  2017 [employer].

## 2023-01-24 DIAGNOSIS — D485 Neoplasm of uncertain behavior of skin: Secondary | ICD-10-CM | POA: Diagnosis not present

## 2023-01-24 DIAGNOSIS — Z85828 Personal history of other malignant neoplasm of skin: Secondary | ICD-10-CM | POA: Diagnosis not present

## 2023-01-24 DIAGNOSIS — C4442 Squamous cell carcinoma of skin of scalp and neck: Secondary | ICD-10-CM | POA: Diagnosis not present

## 2023-02-17 DIAGNOSIS — H43812 Vitreous degeneration, left eye: Secondary | ICD-10-CM | POA: Diagnosis not present

## 2023-02-17 DIAGNOSIS — H2513 Age-related nuclear cataract, bilateral: Secondary | ICD-10-CM | POA: Diagnosis not present

## 2023-02-17 DIAGNOSIS — H16223 Keratoconjunctivitis sicca, not specified as Sjogren's, bilateral: Secondary | ICD-10-CM | POA: Diagnosis not present

## 2023-03-28 ENCOUNTER — Other Ambulatory Visit: Payer: Self-pay | Admitting: Medical Genetics

## 2023-03-28 DIAGNOSIS — Z006 Encounter for examination for normal comparison and control in clinical research program: Secondary | ICD-10-CM

## 2023-03-29 ENCOUNTER — Other Ambulatory Visit: Payer: Self-pay | Admitting: Physician Assistant

## 2023-05-06 ENCOUNTER — Other Ambulatory Visit (HOSPITAL_COMMUNITY)
Admission: RE | Admit: 2023-05-06 | Discharge: 2023-05-06 | Disposition: A | Discharge status: Home / Self Care | Payer: PPO | Source: Ambulatory Visit | Attending: Oncology | Admitting: Oncology

## 2023-05-06 DIAGNOSIS — Z006 Encounter for examination for normal comparison and control in clinical research program: Secondary | ICD-10-CM | POA: Insufficient documentation

## 2023-05-17 LAB — HELIX MOLECULAR SCREEN: Genetic Analysis Overall Interpretation: NEGATIVE

## 2023-06-07 DIAGNOSIS — L820 Inflamed seborrheic keratosis: Secondary | ICD-10-CM | POA: Diagnosis not present

## 2023-06-07 DIAGNOSIS — Z85828 Personal history of other malignant neoplasm of skin: Secondary | ICD-10-CM | POA: Diagnosis not present

## 2023-06-07 DIAGNOSIS — L821 Other seborrheic keratosis: Secondary | ICD-10-CM | POA: Diagnosis not present

## 2023-06-07 DIAGNOSIS — L812 Freckles: Secondary | ICD-10-CM | POA: Diagnosis not present

## 2023-06-22 DIAGNOSIS — Z1212 Encounter for screening for malignant neoplasm of rectum: Secondary | ICD-10-CM | POA: Diagnosis not present

## 2023-06-25 ENCOUNTER — Other Ambulatory Visit: Payer: Self-pay | Admitting: Physician Assistant

## 2023-06-28 ENCOUNTER — Encounter (HOSPITAL_BASED_OUTPATIENT_CLINIC_OR_DEPARTMENT_OTHER): Payer: Self-pay | Admitting: Radiology

## 2023-06-28 ENCOUNTER — Ambulatory Visit (HOSPITAL_BASED_OUTPATIENT_CLINIC_OR_DEPARTMENT_OTHER)
Admission: RE | Admit: 2023-06-28 | Discharge: 2023-06-28 | Disposition: A | Discharge status: Home / Self Care | Payer: PPO | Source: Ambulatory Visit | Attending: Physician Assistant | Admitting: Physician Assistant

## 2023-06-28 DIAGNOSIS — Z1231 Encounter for screening mammogram for malignant neoplasm of breast: Secondary | ICD-10-CM | POA: Diagnosis not present

## 2023-06-29 ENCOUNTER — Other Ambulatory Visit: Payer: Self-pay | Admitting: Physician Assistant

## 2023-06-30 MED ORDER — ALPRAZOLAM 1 MG PO TABS: 1.0000 mg | ORAL_TABLET | Freq: Every evening | ORAL | 0 refills | Status: DC | PRN

## 2023-06-30 NOTE — Telephone Encounter (Signed)
Pt requesting refill for Xanax 1 mg. Last OV 09/28/2022

## 2023-10-03 ENCOUNTER — Encounter: Payer: Self-pay | Admitting: Physician Assistant

## 2023-10-20 ENCOUNTER — Other Ambulatory Visit: Payer: Self-pay | Admitting: Physician Assistant

## 2023-11-28 ENCOUNTER — Other Ambulatory Visit: Payer: Self-pay | Admitting: Physician Assistant

## 2023-11-28 MED ORDER — SERTRALINE HCL 100 MG PO TABS: 100.0000 mg | ORAL_TABLET | Freq: Every day | ORAL | 0 refills | Status: DC

## 2023-12-07 DIAGNOSIS — C44622 Squamous cell carcinoma of skin of right upper limb, including shoulder: Secondary | ICD-10-CM | POA: Diagnosis not present

## 2023-12-07 DIAGNOSIS — L812 Freckles: Secondary | ICD-10-CM | POA: Diagnosis not present

## 2023-12-07 DIAGNOSIS — D1801 Hemangioma of skin and subcutaneous tissue: Secondary | ICD-10-CM | POA: Diagnosis not present

## 2023-12-07 DIAGNOSIS — L821 Other seborrheic keratosis: Secondary | ICD-10-CM | POA: Diagnosis not present

## 2023-12-07 DIAGNOSIS — L820 Inflamed seborrheic keratosis: Secondary | ICD-10-CM | POA: Diagnosis not present

## 2023-12-07 DIAGNOSIS — Z85828 Personal history of other malignant neoplasm of skin: Secondary | ICD-10-CM | POA: Diagnosis not present

## 2023-12-07 DIAGNOSIS — D485 Neoplasm of uncertain behavior of skin: Secondary | ICD-10-CM | POA: Diagnosis not present

## 2023-12-29 ENCOUNTER — Ambulatory Visit: Payer: PPO

## 2024-01-02 ENCOUNTER — Ambulatory Visit

## 2024-01-02 ENCOUNTER — Other Ambulatory Visit: Payer: Self-pay | Admitting: Physician Assistant

## 2024-01-02 NOTE — Telephone Encounter (Signed)
 Pt requesting refill for Alprazolam  1 mg tablet. Last OV 09/2022.

## 2024-01-10 ENCOUNTER — Encounter: Payer: Self-pay | Admitting: Physician Assistant

## 2024-01-10 ENCOUNTER — Other Ambulatory Visit: Payer: Self-pay | Admitting: Physician Assistant

## 2024-01-11 ENCOUNTER — Other Ambulatory Visit: Payer: Self-pay | Admitting: Physician Assistant

## 2024-01-11 MED ORDER — ALPRAZOLAM 1 MG PO TABS: 1.0000 mg | ORAL_TABLET | Freq: Every evening | ORAL | 0 refills | Status: DC | PRN

## 2024-02-10 ENCOUNTER — Other Ambulatory Visit: Payer: Self-pay | Admitting: Physician Assistant

## 2024-02-10 ENCOUNTER — Encounter: Payer: Self-pay | Admitting: Physician Assistant

## 2024-02-10 MED ORDER — SERTRALINE HCL 100 MG PO TABS: 100.0000 mg | ORAL_TABLET | Freq: Every day | ORAL | 0 refills | Status: DC

## 2024-03-08 ENCOUNTER — Other Ambulatory Visit: Payer: Self-pay | Admitting: Physician Assistant

## 2024-03-13 ENCOUNTER — Encounter: Payer: Self-pay | Admitting: Physician Assistant

## 2024-03-13 ENCOUNTER — Ambulatory Visit (INDEPENDENT_AMBULATORY_CARE_PROVIDER_SITE_OTHER): Admitting: Physician Assistant

## 2024-03-13 ENCOUNTER — Ambulatory Visit (INDEPENDENT_AMBULATORY_CARE_PROVIDER_SITE_OTHER)

## 2024-03-13 VITALS — BP 118/74 | HR 73 | Temp 97.3°F | Ht 63.0 in | Wt 140.6 lb

## 2024-03-13 VITALS — BP 136/80 | HR 75 | Temp 97.3°F | Ht 63.0 in | Wt 140.2 lb

## 2024-03-13 DIAGNOSIS — Z0001 Encounter for general adult medical examination with abnormal findings: Secondary | ICD-10-CM

## 2024-03-13 DIAGNOSIS — M8580 Other specified disorders of bone density and structure, unspecified site: Secondary | ICD-10-CM

## 2024-03-13 DIAGNOSIS — Z0000 Encounter for general adult medical examination without abnormal findings: Secondary | ICD-10-CM

## 2024-03-13 DIAGNOSIS — E782 Mixed hyperlipidemia: Secondary | ICD-10-CM

## 2024-03-13 DIAGNOSIS — R1031 Right lower quadrant pain: Secondary | ICD-10-CM

## 2024-03-13 DIAGNOSIS — F330 Major depressive disorder, recurrent, mild: Secondary | ICD-10-CM | POA: Diagnosis not present

## 2024-03-13 DIAGNOSIS — R7303 Prediabetes: Secondary | ICD-10-CM

## 2024-03-13 DIAGNOSIS — Z1211 Encounter for screening for malignant neoplasm of colon: Secondary | ICD-10-CM

## 2024-03-13 LAB — CBC WITH DIFFERENTIAL/PLATELET
Basophils Absolute: 0 K/uL (ref 0.0–0.1)
Basophils Relative: 0.6 % (ref 0.0–3.0)
Eosinophils Absolute: 0.1 K/uL (ref 0.0–0.7)
Eosinophils Relative: 1.3 % (ref 0.0–5.0)
HCT: 42.9 % (ref 36.0–46.0)
Hemoglobin: 14 g/dL (ref 12.0–15.0)
Lymphocytes Relative: 29 % (ref 12.0–46.0)
Lymphs Abs: 1.9 K/uL (ref 0.7–4.0)
MCHC: 32.7 g/dL (ref 30.0–36.0)
MCV: 81.9 fl (ref 78.0–100.0)
Monocytes Absolute: 0.4 K/uL (ref 0.1–1.0)
Monocytes Relative: 5.5 % (ref 3.0–12.0)
Neutro Abs: 4.2 K/uL (ref 1.4–7.7)
Neutrophils Relative %: 63.6 % (ref 43.0–77.0)
Platelets: 367 K/uL (ref 150.0–400.0)
RBC: 5.24 Mil/uL — ABNORMAL HIGH (ref 3.87–5.11)
RDW: 15.4 % (ref 11.5–15.5)
WBC: 6.6 K/uL (ref 4.0–10.5)

## 2024-03-13 LAB — HEMOGLOBIN A1C: Hgb A1c MFr Bld: 6.1 % (ref 4.6–6.5)

## 2024-03-13 LAB — LIPID PANEL
Cholesterol: 287 mg/dL — ABNORMAL HIGH (ref 0–200)
HDL: 72.4 mg/dL (ref >39.00)
LDL Cholesterol: 172 mg/dL — ABNORMAL HIGH (ref 0–99)
NonHDL: 214.65
Total CHOL/HDL Ratio: 4
Triglycerides: 213 mg/dL — ABNORMAL HIGH (ref 0.0–149.0)
VLDL: 42.6 mg/dL — ABNORMAL HIGH (ref 0.0–40.0)

## 2024-03-13 LAB — COMPREHENSIVE METABOLIC PANEL WITH GFR
ALT: 14 U/L (ref 0–35)
AST: 19 U/L (ref 0–37)
Albumin: 4.4 g/dL (ref 3.5–5.2)
Alkaline Phosphatase: 92 U/L (ref 39–117)
BUN: 13 mg/dL (ref 6–23)
CO2: 27 meq/L (ref 19–32)
Calcium: 9 mg/dL (ref 8.4–10.5)
Chloride: 101 meq/L (ref 96–112)
Creatinine, Ser: 0.87 mg/dL (ref 0.40–1.20)
GFR: 67.14 mL/min (ref >60.00)
Glucose, Bld: 93 mg/dL (ref 70–99)
Potassium: 3.8 meq/L (ref 3.5–5.1)
Sodium: 140 meq/L (ref 135–145)
Total Bilirubin: 0.4 mg/dL (ref 0.2–1.2)
Total Protein: 7.7 g/dL (ref 6.0–8.3)

## 2024-03-13 MED ORDER — ALPRAZOLAM 0.25 MG PO TABS: 0.2500 mg | ORAL_TABLET | Freq: Two times a day (BID) | ORAL | 0 refills | Status: DC | PRN

## 2024-03-13 MED ORDER — SERTRALINE HCL 25 MG PO TABS: 25.0000 mg | ORAL_TABLET | Freq: Every day | ORAL | 1 refills | Status: DC

## 2024-03-13 NOTE — Patient Instructions (Signed)
 Ms. Breanna Prince , Thank you for taking time out of your busy schedule to complete your Annual Wellness Visit with me. I enjoyed our conversation and look forward to speaking with you again next year. I, as well as your care team,  appreciate your ongoing commitment to your health goals. Please review the following plan we discussed and let me know if I can assist you in the future. Your Game plan/ To Do List    Referrals: If you haven't heard from the office you've been referred to, please reach out to them at the phone provided.   Follow up Visits: We will see or speak with you next year for your Next Medicare AWV with our clinical staff Have you seen your provider in the last 6 months (3 months if uncontrolled diabetes)? No  Clinician Recommendations:  Aim for 30 minutes of exercise or brisk walking, 6-8 glasses of water, and 5 servings of fruits and vegetables each day.       This is a list of the screenings recommended for you:  Health Maintenance  Topic Date Due   Hepatitis C Screening  Never done   Colon Cancer Screening  03/08/2023   COVID-19 Vaccine (6 - 2024-25 season) 03/20/2023   Medicare Annual Wellness Visit  12/21/2023   Flu Shot  02/17/2024   Mammogram  06/27/2025   DTaP/Tdap/Td vaccine (2 - Td or Tdap) 04/20/2027   Pneumococcal Vaccine for age over 65  Completed   DEXA scan (bone density measurement)  Completed   HPV Vaccine  Aged Out   Meningitis B Vaccine  Aged Out   Zoster (Shingles) Vaccine  Discontinued    Advanced directives: (Copy Requested) Please bring a copy of your health care power of attorney and living will to the office to be added to your chart at your convenience. You can mail to [facility] [address] [place], [place] [address] or email to [email or web address] Advance Care Planning is important because it:  [x]  Makes sure you receive the medical care that is consistent with your values, goals, and preferences  [x]  It provides  guidance to your family and loved ones and reduces their decisional burden about whether or not they are making the right decisions based on your wishes.  Follow the link provided in your after visit summary or read over the paperwork we have mailed to you to help you started getting your Advance Directives in place. If you need assistance in completing these, please reach out to us  so that we can help you!  See attachments for Preventive Care and Fall Prevention Tips.

## 2024-03-13 NOTE — Progress Notes (Signed)
 Subjective:    Breanna Prince is a 73 y.o. female and is here for a comprehensive physical exam.  HPI  Health Maintenance Due  Topic Date Due   Hepatitis C Screening  Never done   Fecal DNA (Cologuard)  Never done   DEXA SCAN  04/14/2023    Discussed the use of AI scribe software for clinical note transcription with the patient, who gave verbal consent to proceed.  History of Present Illness Breanna Prince is a 73 year old female who presents with intermittent diarrhea and abdominal discomfort for two months.  She experiences intermittent diarrhea with episodes followed by two days of normal bowel movements. Abdominal discomfort is persistent and localized. Dietary modifications, including eliminating dairy and gluten, have not provided relief. There is no blood in stool, vomiting, or significant weight loss, though she has lost about two pounds due to reduced food intake. Mild nausea accompanies the diarrhea episodes.  Her past medical history includes a clear colonoscopy in 2014 and a negative mail-in colon cancer screening last year. She has a family history of colon cancer in her aunt and has not had a colonoscopy since turning 89.  She takes Zoloft  and has considered increasing the dose due to feeling 'blah' and experiencing burning mouth syndrome. She also takes alprazolam  as needed. She reports mild abdominal discomfort and occasional acid reflux.    Health Maintenance: Immunizations -- utd Colonoscopy -- overdue, Cologuard ordered Mammogram -- utd PAP -- n/a Bone Density -- overdue -- ordered Diet -- wide variety of foods Exercise -- as able  Mood -- stable  UTD with dentist? - yes UTD with eye doctor? - yes  Weight history: Wt Readings from Last 10 Encounters:  03/13/24 140 lb 4 oz (63.6 kg)  03/13/24 140 lb 9.6 oz (63.8 kg)  12/21/22 142 lb (64.4 kg)  09/28/22 142 lb 4 oz (64.5 kg)  02/22/22 138 lb 6.4 oz (62.8 kg)  01/01/22 141 lb 6 oz (64.1 kg)   12/15/21 141 lb (64 kg)  12/26/20 138 lb (62.6 kg)   Body mass index is 24.84 kg/m. No LMP recorded. Patient is postmenopausal.  Alcohol use:  reports current alcohol use.  Tobacco use:  Tobacco Use: Medium Risk (03/13/2024)   Patient History    Smoking Tobacco Use: Former    Smokeless Tobacco Use: Never    Passive Exposure: Not on file   Eligible for lung cancer screening? no     03/13/2024   10:45 AM  Depression screen PHQ 2/9  Decreased Interest 0  Down, Depressed, Hopeless 1  PHQ - 2 Score 1  Altered sleeping 3  Tired, decreased energy 3  Change in appetite 0  Feeling bad or failure about yourself  1  Trouble concentrating 0  Moving slowly or fidgety/restless 0  Suicidal thoughts 0  PHQ-9 Score 8  Difficult doing work/chores Not difficult at all     Other providers/specialists: Patient Care Team: Job Lukes, [place] as PCP - General (Physician Assistant)    PMHx, SurgHx, SocialHx, Medications, and Allergies were reviewed in the Visit Navigator and updated as appropriate.   Past Medical History:  Diagnosis Date   Anxiety    Depression    Hyperlipidemia    Neuropathy 12/15/2021   Tinnitus of both ears    Vaginal delivery    1976, 1977, 1980, 1982    No past surgical history on file.   Family History  Problem Relation Age of Onset   Ovarian cancer  Mother    Cancer Mother    Miscarriages / Stillbirths Mother    Congestive Heart Failure Father    Heart disease Father    Cancer Sister        squamous cell rectal cancer   Heart disease Sister    Heart disease Brother     Social History   Tobacco Use   Smoking status: Former    Current packs/day: 0.00    Average packs/day: 0.5 packs/day for 10.0 years (5.0 ttl pk-yrs)    Types: Cigarettes    Start date: 07/19/1973    Quit date: 07/20/1983    Years since quitting: 40.6   Smokeless tobacco: Never  Vaping Use   Vaping status: Never Used  Substance Use Topics   Alcohol use: Yes    Comment:  Occasionally   Drug use: Not Currently    Types: Marijuana    Review of Systems:   Review of Systems  Constitutional:  Negative for chills, fever, malaise/fatigue and weight loss.  HENT:  Negative for hearing loss, sinus pain and sore throat.   Respiratory:  Negative for cough and hemoptysis.   Cardiovascular:  Negative for chest pain, palpitations, leg swelling and PND.  Gastrointestinal:  Negative for abdominal pain, constipation, diarrhea, heartburn, nausea and vomiting.  Genitourinary:  Negative for dysuria, frequency and urgency.  Musculoskeletal:  Negative for back pain, myalgias and neck pain.  Skin:  Negative for itching and rash.  Neurological:  Negative for dizziness, tingling, seizures and headaches.  Endo/Heme/Allergies:  Negative for polydipsia.  Psychiatric/Behavioral:  Negative for depression. The patient is not nervous/anxious.     Objective:   BP 136/80 (BP Location: Left Arm, Patient Position: Sitting, Cuff Size: Normal)   Pulse 75   Temp (!) 97.3 F (36.3 C) (Temporal)   Ht 5' 3 (1.6 m)   Wt 140 lb 4 oz (63.6 kg)   SpO2 98%   BMI 24.84 kg/m  Body mass index is 24.84 kg/m.   General Appearance:    Alert, cooperative, no distress, appears stated age  Head:    Normocephalic, without obvious abnormality, atraumatic  Eyes:    PERRL, conjunctiva/corneas clear, EOM's intact, fundi    benign, both eyes  Ears:    Normal TM's and external ear canals, both ears  Nose:   Nares normal, septum midline, mucosa normal, no drainage    or sinus tenderness  Throat:   Lips, mucosa, and tongue normal; teeth and gums normal  Neck:   Supple, symmetrical, trachea midline, no adenopathy;    thyroid :  no enlargement/tenderness/nodules; no carotid   bruit or JVD  Back:     Symmetric, no curvature, ROM normal, no CVA tenderness  Lungs:     Clear to auscultation bilaterally, respirations unlabored  Chest Wall:    No tenderness or deformity   Heart:    Regular rate and rhythm,  S1 and S2 normal, no murmur, rub or gallop  Breast Exam:    Deferred  Abdomen:     Soft, non-tender, bowel sounds active all four quadrants,    no masses, no organomegaly  Genitalia:    Deferred  Extremities:   Extremities normal, atraumatic, no cyanosis or edema  Pulses:   2+ and symmetric all extremities  Skin:   Skin color, texture, turgor normal, no rashes or lesions  Lymph nodes:   Cervical, supraclavicular, and axillary nodes normal  Neurologic:   CNII-XII intact, normal strength, sensation and reflexes    throughout  Assessment/Plan:   Assessment and Plan Assessment & Plan Comprehensive Physical Exam (CPE) preventive care annual visit Today patient counseled on age appropriate routine health concerns for screening and prevention, each reviewed and up to date or declined. Immunizations reviewed and up to date or declined. Labs ordered and reviewed. Risk factors for depression reviewed and negative. Hearing function and visual acuity are intact. ADLs screened and addressed as needed. Functional ability and level of safety reviewed and appropriate. Education, counseling and referrals performed based on assessed risks today. Patient provided with a copy of personalized plan for preventive services.  Chronic intermittent diarrhea and abdominal discomfort Chronic diarrhea and abdominal discomfort without clear dietary triggers. Differential includes dietary changes, aging, or GI pathology. Previous colonoscopy clear. - Provided handout on bowel health and fiber intake. - Encouraged intake of 64 ounces of water daily. - Encouraged high fiber diet including vegetables and whole grains. - Ordered CT scan of the abdomen. - Considered referral to gastroenterology if no improvement with dietary changes.  Depressive symptoms Patient reports feeling blah and worried for a long time. Long-term sertraline  use at 100 mg daily. Interest in dosage increase. 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 - Increased sertraline  (Zoloft ) to 125 mg daily. - Reassess symptoms in four weeks to evaluate effectiveness of dosage increase.  Osteopenia Osteopenia diagnosed in September 2022. Bone density scan due for re-evaluation. - Ordered bone density scan. - Encouraged strength training and adequate intake of vitamin D and calcium.      Lucie Buttner, PA-C [facility]

## 2024-03-13 NOTE — Progress Notes (Addendum)
 Subjective:   KARENA KINKER is a 73 y.o. who presents for a Medicare Wellness preventive visit.  As a reminder, Annual Wellness Visits don't include a physical exam, and some assessments may be limited, especially if this visit is performed virtually. We may recommend an in-person follow-up visit with your provider if needed.  Visit Complete: In person    Persons Participating in Visit: Patient.  AWV Questionnaire: Yes: Patient Medicare AWV questionnaire was completed by the patient on 03/09/24; I have confirmed that all information answered by patient is correct and no changes since this date.  Cardiac Risk Factors include: advanced age (>63men, >6 women);dyslipidemia     Objective:    Today's Vitals   03/13/24 1040  BP: 118/74  Pulse: 73  Temp: (!) 97.3 F (36.3 C)  SpO2: 96%  Weight: 140 lb 9.6 oz (63.8 kg)  Height: 5' 3 (1.6 m)   Body mass index is 24.91 kg/m.     03/13/2024   10:48 AM 12/21/2022    3:31 PM 01/15/2022   10:21 AM 12/26/2020    7:55 AM  Advanced Directives  Does Patient Have a Medical Advance Directive? Yes Yes Yes Yes  Type of Estate agent of [place];Living will Healthcare Power of [place];Living will Healthcare Power of Attorney Living will  Copy of Healthcare Power of Attorney in Chart? No - copy requested No - copy requested No - copy requested     Current Medications (verified) Outpatient Encounter Medications as of 03/13/2024  Medication Sig   Alpha-Lipoic Acid 600 MG CAPS Take 2 capsules by mouth daily in the afternoon.   ALPRAZolam  (XANAX ) 1 MG tablet Take 1 tablet (1 mg total) by mouth at bedtime as needed for anxiety.   Magnesium 400 MG CAPS 1 tablet   sertraline  (ZOLOFT ) 100 MG tablet TAKE 1 TABLET BY MOUTH DAILY   No facility-administered encounter medications on file as of 03/13/2024.    Allergies (verified) Latex   History: Past Medical History:  Diagnosis Date   Anxiety    Depression     Hyperlipidemia    Neuropathy 12/15/2021   Tinnitus of both ears    Vaginal delivery    1976, 1977, 1980, 1982   History reviewed. No pertinent surgical history. Family History  Problem Relation Age of Onset   Ovarian cancer Mother    Cancer Mother    Miscarriages / Stillbirths Mother    Congestive Heart Failure Father    Heart disease Father    Cancer Sister        squamous cell rectal cancer   Heart disease Sister    Heart disease Brother    Social History   Socioeconomic History   Marital status: Married    Spouse name: Not on file   Number of children: 3   Years of education: Not on file   Highest education level: Some college, no degree  Occupational History   Not on file  Tobacco Use   Smoking status: Former    Current packs/day: 0.00    Average packs/day: 0.5 packs/day for 10.0 years (5.0 ttl pk-yrs)    Types: Cigarettes    Start date: 07/19/1973    Quit date: 07/20/1983    Years since quitting: 40.6   Smokeless tobacco: Never  Vaping Use   Vaping status: Never Used  Substance and Sexual Activity   Alcohol use: Yes    Comment: Occasionally   Drug use: Not Currently    Types: Marijuana  Sexual activity: Yes    Birth control/protection: None  Other Topics Concern   Not on file  Social History Narrative   Lives with husband   Retired from [employer]    Social Drivers of Health   Financial Resource Strain: Low Risk  (03/09/2024)   Overall Financial Resource Strain (CARDIA)    Difficulty of Paying Living Expenses: Not hard at all  Food Insecurity: No Food Insecurity (03/09/2024)   Hunger Vital Sign    Worried About Running Out of Food in the Last Year: Never true    Ran Out of Food in the Last Year: Never true  Transportation Needs: No Transportation Needs (03/09/2024)   PRAPARE - Administrator, Civil Service (Medical): No    Lack of Transportation (Non-Medical): No  Physical Activity: Sufficiently Active (03/09/2024)   Exercise Vital Sign    Days of  Exercise per Week: 4 days    Minutes of Exercise per Session: 40 min  Recent Concern: Physical Activity - Insufficiently Active (12/29/2023)   Exercise Vital Sign    Days of Exercise per Week: 3 days    Minutes of Exercise per Session: 30 min  Stress: No Stress Concern Present (03/09/2024)   [employer] of Occupational Health - Occupational Stress Questionnaire    Feeling of Stress: Only a little  Social Connections: Socially Integrated (03/09/2024)   Social Connection and Isolation Panel    Frequency of Communication with Friends and Family: Twice a week    Frequency of Social Gatherings with Friends and Family: Twice a week    Attends Religious Services: More than 4 times per year    Active Member of [employer] or Organizations: Yes    Attends Engineer, structural: More than 4 times per year    Marital Status: Married    Tobacco Counseling Counseling given: Not Answered    Clinical Intake:  Pre-visit preparation completed: Yes  Pain : 0-10 Pain Type: Chronic pain Pain Location: Abdomen Pain Descriptors / Indicators: Cramping Pain Onset: More than a month ago Pain Frequency: Constant     BMI - recorded: 24.91 Nutritional Status: BMI of 19-24  Normal Nutritional Risks: None Diabetes: No  No results found for: HGBA1C   How often do you need to have someone help you when you read instructions, pamphlets, or other written materials from your doctor or pharmacy?: 1 - Never  Interpreter Needed?: No  Information entered by :: Ellouise Haws, LPN   Activities of Daily Living     03/09/2024    4:37 PM  In your present state of health, do you have any difficulty performing the following activities:  Hearing? 1  Comment hearing aids  Vision? 0  Difficulty concentrating or making decisions? 0  Walking or climbing stairs? 0  Dressing or bathing? 0  Doing errands, shopping? 0  Preparing Food and eating ? N  Using the Toilet? N  In the past six months, have  you accidently leaked urine? N  Do you have problems with loss of bowel control? N  Managing your Medications? N  Managing your Finances? N  Housekeeping or managing your Housekeeping? N    Patient Care Team: Job Lukes, [place] as PCP - General (Physician Assistant)  I have updated your Care Teams any recent Medical Services you may have received from other providers in the past year.     Assessment:   This is a routine wellness examination for [place].  Hearing/Vision screen Hearing Screening - Comments:: Pt  has hearing aids  Vision Screening - Comments:: Wears rx glasses - up to date with routine eye exams with Vision sources    Goals Addressed             This Visit's Progress    Patient Stated       Get back into working out more        Depression Screen     03/13/2024   10:45 AM 12/21/2022    3:30 PM 09/28/2022   10:32 AM 02/22/2022    9:28 AM 01/15/2022   10:20 AM 12/15/2021    9:06 AM  PHQ 2/9 Scores  PHQ - 2 Score 1 0 0 2 0 2  PHQ- 9 Score 8  3 4  7     Fall Risk     03/09/2024    4:37 PM 12/17/2022   10:13 AM 02/22/2022    9:28 AM 01/15/2022   10:21 AM 12/15/2021    8:54 AM  Fall Risk   Falls in the past year? 0 0 0 0 0  Number falls in past yr:   0 0 0  Injury with Fall?   0 0 0  Risk for fall due to : No Fall Risks Impaired vision No Fall Risks Impaired vision   Follow up Falls prevention discussed Falls prevention discussed  Falls prevention discussed  Falls evaluation completed      Data saved with a previous flowsheet row definition    MEDICARE RISK AT HOME:  Medicare Risk at Home Any stairs in or around the home?: (Patient-Rptd) No Home free of loose throw rugs in walkways, pet beds, electrical cords, etc?: (Patient-Rptd) Yes Adequate lighting in your home to reduce risk of falls?: (Patient-Rptd) Yes Life alert?: (Patient-Rptd) No Use of a cane, walker or w/c?: (Patient-Rptd) No Grab bars in the bathroom?: (Patient-Rptd) Yes Shower chair or  bench in shower?: (Patient-Rptd) Yes Elevated toilet seat or a handicapped toilet?: (Patient-Rptd) No  TIMED UP AND GO:  Was the test performed?  Yes  Length of time to ambulate 10 feet: 10 sec Gait steady and fast without use of assistive device  Cognitive Function: 6CIT completed        03/13/2024   10:49 AM 12/21/2022    3:33 PM 01/15/2022   10:22 AM  6CIT Screen  What Year? 0 points 0 points 0 points  What month? 0 points 0 points 0 points  What time? 0 points 0 points 0 points  Count back from 20 0 points 0 points 0 points  Months in reverse 0 points 0 points 0 points  Repeat phrase 0 points 0 points 0 points  Total Score 0 points 0 points 0 points    Immunizations Immunization History  Administered Date(s) Administered   Fluad Quad(high Dose 65+) 04/05/2022   Influenza,inj,Quad PF,6+ Mos 04/19/2017, 04/24/2018, 04/17/2020, 04/28/2021   Influenza-Unspecified 04/19/2017   PFIZER(Purple Top)SARS-COV-2 Vaccination 08/21/2019, 09/14/2019, 04/22/2020, 10/25/2020   Pfizer(Comirnaty)Fall Seasonal Vaccine 12 years and older 04/30/2022   Pneumococcal Conjugate-13 04/24/2018   Pneumococcal Polysaccharide-23 05/18/2019   Tdap 04/19/2017    Screening Tests Health Maintenance  Topic Date Due   Hepatitis C Screening  Never done   Colonoscopy  03/08/2023   COVID-19 Vaccine (6 - 2024-25 season) 03/20/2023   INFLUENZA VACCINE  02/17/2024   Medicare Annual Wellness (AWV)  03/13/2025   MAMMOGRAM  06/27/2025   DTaP/Tdap/Td (2 - Td or Tdap) 04/20/2027   Pneumococcal Vaccine: 50+ Years  Completed   DEXA SCAN  Completed   HPV VACCINES  Aged Out   Meningococcal B Vaccine  Aged Out   Zoster Vaccines- Shingrix  Discontinued    Health Maintenance  Health Maintenance Due  Topic Date Due   Hepatitis C Screening  Never done   Colonoscopy  03/08/2023   COVID-19 Vaccine (6 - 2024-25 season) 03/20/2023   INFLUENZA VACCINE  02/17/2024   Health Maintenance Items Addressed: See Nurse  Notes at the end of this note  Additional Screening:  Vision Screening: Recommended annual ophthalmology exams for early detection of glaucoma and other disorders of the eye. Would you like a referral to an eye doctor? No    Dental Screening: Recommended annual dental exams for proper oral hygiene  Community Resource Referral / Chronic Care Management: CRR required this visit?  No   CCM required this visit?  No   Plan:    I have personally reviewed and noted the following in the patient's chart:   Medical and social history Use of alcohol, tobacco or illicit drugs  Current medications and supplements including opioid prescriptions. Patient is not currently taking opioid prescriptions. Functional ability and status Nutritional status Physical activity Advanced directives List of other physicians Hospitalizations, surgeries, and ER visits in previous 12 months Vitals Screenings to include cognitive, depression, and falls Referrals and appointments  In addition, I have reviewed and discussed with patient certain preventive protocols, quality metrics, and best practice recommendations. A written personalized care plan for preventive services as well as general preventive health recommendations were provided to patient.   Ellouise VEAR Haws, LPN   1/73/[id number]   After Visit Summary: (In Person-Printed) AVS printed and given to the patient  Notes: Pt complained of abdominal pain during visit stating loose stools for last 2 months pt was able to get an appt today

## 2024-03-13 NOTE — Patient Instructions (Signed)
  VISIT SUMMARY: During your visit, we discussed your intermittent diarrhea and abdominal discomfort, depressive symptoms, and osteopenia. We also reviewed your general health maintenance and planned follow-up steps.  YOUR PLAN: CHRONIC INTERMITTENT DIARRHEA AND ABDOMINAL DISCOMFORT: You have been experiencing intermittent diarrhea and abdominal discomfort for two months without clear dietary triggers. -Follow a high fiber diet including vegetables and whole grains. -Drink 64 ounces of water daily. -We provided you with a handout on bowel health and fiber intake. -We ordered a CT scan of your abdomen. -If there is no improvement with dietary changes, we may refer you to a gastroenterologist.  DEPRESSIVE SYMPTOMS: You have been feeling 'blah' and 'worried' for a long time, and you are currently taking sertraline  (Zoloft ) at 100 mg daily. -Increase your sertraline  (Zoloft ) dose to 125 mg daily. -We will reassess your symptoms in four weeks to see if the dosage increase is effective.  OSTEOPENIA: You were diagnosed with osteopenia in September 2022. -We ordered a bone density scan to re-evaluate your condition. -Engage in strength training exercises. -Ensure you are getting enough vitamin D and calcium.  GENERAL HEALTH MAINTENANCE: We reviewed your routine health maintenance needs. -We ordered blood work since it was missed last year. -We resubmitted the Cologuard order for colorectal cancer screening.  FOLLOW-UP: We discussed follow-up plans for ongoing management and evaluation of your current health issues. -We scheduled a CT scan of your abdomen. -Follow up with us  to review the results of your CT scan and blood work. -We may refer you to a gastroenterologist based on your CT scan results.                      Contains text generated by Abridge.                                 Contains text generated by Abridge.

## 2024-03-14 ENCOUNTER — Ambulatory Visit: Payer: Self-pay | Admitting: Physician Assistant

## 2024-03-16 ENCOUNTER — Telehealth: Payer: Self-pay | Admitting: *Deleted

## 2024-03-16 ENCOUNTER — Ambulatory Visit
Admission: RE | Admit: 2024-03-16 | Discharge: 2024-03-16 | Disposition: A | Discharge status: Home / Self Care | Source: Ambulatory Visit | Attending: Physician Assistant | Admitting: Physician Assistant

## 2024-03-16 DIAGNOSIS — R1031 Right lower quadrant pain: Secondary | ICD-10-CM

## 2024-03-16 DIAGNOSIS — K760 Fatty (change of) liver, not elsewhere classified: Secondary | ICD-10-CM | POA: Diagnosis not present

## 2024-03-16 MED ORDER — IOPAMIDOL (ISOVUE-300) INJECTION 61%
100.0000 mL | Freq: Once | INTRAVENOUS | Status: AC | PRN
  Administered 2024-03-16: 100 mL via INTRAVENOUS

## 2024-03-16 NOTE — Telephone Encounter (Signed)
 Received fax from [facility] regarding order for Cologuard need different diagnosis code. Discussed with Lucie code should be Z12.11 Screening for colon cancer.  Building control surveyor and spoke to [place], told her received fax regarding diagnosis code which is wrong, need to change to screening for colon cancer Z12.11. Melissa verbalized understanding and will change code and will send patient material for test.

## 2024-03-23 DIAGNOSIS — Z1211 Encounter for screening for malignant neoplasm of colon: Secondary | ICD-10-CM | POA: Diagnosis not present

## 2024-03-30 LAB — COLOGUARD: COLOGUARD: NEGATIVE

## 2024-04-04 ENCOUNTER — Other Ambulatory Visit: Payer: Self-pay | Admitting: Physician Assistant

## 2024-04-18 ENCOUNTER — Other Ambulatory Visit: Payer: Self-pay | Admitting: Physician Assistant

## 2024-04-18 DIAGNOSIS — Z1231 Encounter for screening mammogram for malignant neoplasm of breast: Secondary | ICD-10-CM

## 2024-04-20 ENCOUNTER — Other Ambulatory Visit: Payer: Self-pay | Admitting: Physician Assistant

## 2024-04-20 ENCOUNTER — Encounter: Payer: Self-pay | Admitting: Physician Assistant

## 2024-04-20 DIAGNOSIS — E2839 Other primary ovarian failure: Secondary | ICD-10-CM

## 2024-04-20 MED ORDER — SERTRALINE HCL 100 MG PO TABS: 100.0000 mg | ORAL_TABLET | Freq: Every day | ORAL | 3 refills | Status: AC

## 2024-04-20 MED ORDER — ALPRAZOLAM 1 MG PO TABS: 1.0000 mg | ORAL_TABLET | Freq: Every evening | ORAL | 0 refills | Status: AC | PRN

## 2024-04-23 NOTE — Telephone Encounter (Signed)
Vaccines documented.  

## 2024-05-02 ENCOUNTER — Telehealth: Payer: Self-pay | Admitting: *Deleted

## 2024-05-02 NOTE — Telephone Encounter (Signed)
 Please call patient to schedule Bone Density scan. Orders are in Epic.

## 2024-05-02 NOTE — Telephone Encounter (Signed)
 Copied from CRM #[id number]. Topic: Appointments - Scheduling Inquiry for Clinic >> May 02, 2024 11:41 AM Revonda D wrote: Reason for CRM: Pt stated that she was advised by her provider [facility], to reach out to the office to schedule an appt for a bone density scan at the [facility] building.

## 2024-05-10 ENCOUNTER — Inpatient Hospital Stay: Admission: RE | Admit: 2024-05-10 | Discharge: 2024-05-10 | Attending: Physician Assistant

## 2024-05-10 DIAGNOSIS — E2839 Other primary ovarian failure: Secondary | ICD-10-CM

## 2024-05-13 ENCOUNTER — Ambulatory Visit: Payer: Self-pay | Admitting: Physician Assistant

## 2024-05-16 ENCOUNTER — Ambulatory Visit (INDEPENDENT_AMBULATORY_CARE_PROVIDER_SITE_OTHER): Admitting: Otolaryngology

## 2024-06-20 DIAGNOSIS — L820 Inflamed seborrheic keratosis: Secondary | ICD-10-CM | POA: Diagnosis not present

## 2024-06-20 DIAGNOSIS — C44629 Squamous cell carcinoma of skin of left upper limb, including shoulder: Secondary | ICD-10-CM | POA: Diagnosis not present

## 2024-06-20 DIAGNOSIS — L821 Other seborrheic keratosis: Secondary | ICD-10-CM | POA: Diagnosis not present

## 2024-06-20 DIAGNOSIS — Z85828 Personal history of other malignant neoplasm of skin: Secondary | ICD-10-CM | POA: Diagnosis not present

## 2024-06-20 DIAGNOSIS — D485 Neoplasm of uncertain behavior of skin: Secondary | ICD-10-CM | POA: Diagnosis not present

## 2024-06-20 DIAGNOSIS — L812 Freckles: Secondary | ICD-10-CM | POA: Diagnosis not present

## 2024-06-28 ENCOUNTER — Ambulatory Visit
Admission: RE | Admit: 2024-06-28 | Discharge: 2024-06-28 | Disposition: A | Discharge status: Home / Self Care | Source: Ambulatory Visit | Attending: Physician Assistant | Admitting: Physician Assistant

## 2024-06-28 DIAGNOSIS — Z1231 Encounter for screening mammogram for malignant neoplasm of breast: Secondary | ICD-10-CM

## 2024-07-03 ENCOUNTER — Encounter

## 2024-08-22 ENCOUNTER — Encounter: Payer: Self-pay | Admitting: Physician Assistant

## 2025-03-18 ENCOUNTER — Ambulatory Visit
